# Patient Record
Sex: Male | Born: 1975 | Race: White | Hispanic: No | Marital: Married | Smoking: Never smoker
Health system: Southern US, Community
[De-identification: ages and names within clinical notes are randomized; demographics above are authoritative.]

## PROBLEM LIST (undated history)

## (undated) DIAGNOSIS — K449 Diaphragmatic hernia without obstruction or gangrene: Secondary | ICD-10-CM

## (undated) DIAGNOSIS — J939 Pneumothorax, unspecified: Secondary | ICD-10-CM

## (undated) HISTORY — PX: CHEST TUBE INSERTION: SHX231

---

## 2001-10-16 ENCOUNTER — Encounter: Payer: Self-pay | Admitting: Emergency Medicine

## 2001-10-16 ENCOUNTER — Emergency Department (HOSPITAL_COMMUNITY): Admission: EM | Admit: 2001-10-16 | Discharge: 2001-10-16 | Payer: Self-pay

## 2004-01-26 ENCOUNTER — Emergency Department (HOSPITAL_COMMUNITY): Admission: EM | Admit: 2004-01-26 | Discharge: 2004-01-27 | Payer: Self-pay | Admitting: *Deleted

## 2013-03-21 ENCOUNTER — Inpatient Hospital Stay (HOSPITAL_COMMUNITY)
Admission: EM | Admit: 2013-03-21 | Discharge: 2013-03-27 | DRG: 201 | Disposition: A | Discharge status: Home / Self Care | Payer: Self-pay | Attending: General Surgery | Admitting: General Surgery

## 2013-03-21 ENCOUNTER — Encounter (HOSPITAL_COMMUNITY): Payer: Self-pay | Admitting: Emergency Medicine

## 2013-03-21 ENCOUNTER — Emergency Department (HOSPITAL_COMMUNITY): Payer: Self-pay

## 2013-03-21 DIAGNOSIS — K449 Diaphragmatic hernia without obstruction or gangrene: Secondary | ICD-10-CM | POA: Diagnosis present

## 2013-03-21 DIAGNOSIS — J9383 Other pneumothorax: Principal | ICD-10-CM | POA: Diagnosis present

## 2013-03-21 DIAGNOSIS — Z881 Allergy status to other antibiotic agents status: Secondary | ICD-10-CM

## 2013-03-21 DIAGNOSIS — J939 Pneumothorax, unspecified: Secondary | ICD-10-CM

## 2013-03-21 DIAGNOSIS — F17200 Nicotine dependence, unspecified, uncomplicated: Secondary | ICD-10-CM | POA: Diagnosis present

## 2013-03-21 HISTORY — DX: Diaphragmatic hernia without obstruction or gangrene: K44.9

## 2013-03-21 HISTORY — DX: Pneumothorax, unspecified: J93.9

## 2013-03-21 LAB — COMPREHENSIVE METABOLIC PANEL
ALT: 11 U/L (ref 0–53)
AST: 15 U/L (ref 0–37)
Albumin: 4.5 g/dL (ref 3.5–5.2)
Alkaline Phosphatase: 67 U/L (ref 39–117)
BILIRUBIN TOTAL: 0.4 mg/dL (ref 0.3–1.2)
BUN: 7 mg/dL (ref 6–23)
CO2: 30 meq/L (ref 19–32)
Calcium: 9.7 mg/dL (ref 8.4–10.5)
Chloride: 102 mEq/L (ref 96–112)
Creatinine, Ser: 0.96 mg/dL (ref 0.50–1.35)
GFR calc Af Amer: >90 mL/min (ref >90)
GFR calc non Af Amer: >90 mL/min (ref >90)
GLUCOSE: 107 mg/dL — AB (ref 70–99)
Potassium: 4.8 mEq/L (ref 3.7–5.3)
Sodium: 143 mEq/L (ref 137–147)
Total Protein: 7.9 g/dL (ref 6.0–8.3)

## 2013-03-21 LAB — CBC WITH DIFFERENTIAL/PLATELET
Basophils Absolute: 0 10*3/uL (ref 0.0–0.1)
Basophils Relative: 1 % (ref 0–1)
Eosinophils Absolute: 0.1 10*3/uL (ref 0.0–0.7)
Eosinophils Relative: 1 % (ref 0–5)
HCT: 43.9 % (ref 39.0–52.0)
Hemoglobin: 15.3 g/dL (ref 13.0–17.0)
LYMPHS ABS: 1.5 10*3/uL (ref 0.7–4.0)
Lymphocytes Relative: 20 % (ref 12–46)
MCH: 33 pg (ref 26.0–34.0)
MCHC: 34.9 g/dL (ref 30.0–36.0)
MCV: 94.8 fL (ref 78.0–100.0)
MONOS PCT: 5 % (ref 3–12)
Monocytes Absolute: 0.4 10*3/uL (ref 0.1–1.0)
NEUTROS ABS: 5.5 10*3/uL (ref 1.7–7.7)
Neutrophils Relative %: 73 % (ref 43–77)
Platelets: 190 10*3/uL (ref 150–400)
RBC: 4.63 MIL/uL (ref 4.22–5.81)
RDW: 12.3 % (ref 11.5–15.5)
WBC: 7.6 10*3/uL (ref 4.0–10.5)

## 2013-03-21 LAB — TROPONIN I: Troponin I: <0.3 ng/mL (ref <0.30)

## 2013-03-21 MED ORDER — DIPHENHYDRAMINE HCL 12.5 MG/5ML PO ELIX
12.5000 mg | ORAL_SOLUTION | Freq: Four times a day (QID) | ORAL | Status: DC | PRN
  Filled 2013-03-21: qty 10

## 2013-03-21 MED ORDER — ACETAMINOPHEN 650 MG RE SUPP: 650.0000 mg | Freq: Four times a day (QID) | RECTAL | Status: DC | PRN

## 2013-03-21 MED ORDER — ENOXAPARIN SODIUM 40 MG/0.4ML ~~LOC~~ SOLN
40.0000 mg | SUBCUTANEOUS | Status: DC
  Administered 2013-03-21 – 2013-03-26 (×5): 40 mg via SUBCUTANEOUS
  Filled 2013-03-21 (×6): qty 0.4

## 2013-03-21 MED ORDER — ONDANSETRON HCL 4 MG/2ML IJ SOLN
4.0000 mg | Freq: Four times a day (QID) | INTRAMUSCULAR | Status: DC | PRN
  Administered 2013-03-22: 4 mg via INTRAVENOUS
  Filled 2013-03-21: qty 2

## 2013-03-21 MED ORDER — HYDROMORPHONE HCL PF 1 MG/ML IJ SOLN: 1.0000 mg | Freq: Once | INTRAMUSCULAR | Status: AC

## 2013-03-21 MED ORDER — SODIUM CHLORIDE 0.9 % IV BOLUS (SEPSIS)
500.0000 mL | Freq: Once | INTRAVENOUS | Status: AC
  Administered 2013-03-21: 500 mL via INTRAVENOUS

## 2013-03-21 MED ORDER — ONDANSETRON HCL 4 MG/2ML IJ SOLN
4.0000 mg | Freq: Once | INTRAMUSCULAR | Status: AC
  Administered 2013-03-21: 4 mg via INTRAVENOUS
  Filled 2013-03-21: qty 2

## 2013-03-21 MED ORDER — DIPHENHYDRAMINE HCL 50 MG/ML IJ SOLN: 12.5000 mg | Freq: Four times a day (QID) | INTRAMUSCULAR | Status: DC | PRN

## 2013-03-21 MED ORDER — HYDROMORPHONE HCL PF 1 MG/ML IJ SOLN
1.0000 mg | INTRAMUSCULAR | Status: DC | PRN
  Administered 2013-03-26: 1 mg via INTRAVENOUS
  Filled 2013-03-21: qty 1

## 2013-03-21 MED ORDER — HYDROMORPHONE HCL PF 1 MG/ML IJ SOLN
1.0000 mg | Freq: Once | INTRAMUSCULAR | Status: AC
  Administered 2013-03-21: 1 mg via INTRAVENOUS
  Filled 2013-03-21: qty 1

## 2013-03-21 MED ORDER — ACETAMINOPHEN 325 MG PO TABS: 650.0000 mg | ORAL_TABLET | Freq: Four times a day (QID) | ORAL | Status: DC | PRN

## 2013-03-21 MED ORDER — SODIUM CHLORIDE 0.9 % IV SOLN
INTRAVENOUS | Status: DC
  Administered 2013-03-21: 16:00:00 via INTRAVENOUS

## 2013-03-21 MED ORDER — OXYCODONE HCL 5 MG PO TABS
5.0000 mg | ORAL_TABLET | ORAL | Status: DC | PRN
  Administered 2013-03-22 (×2): 5 mg via ORAL
  Filled 2013-03-21 (×2): qty 1

## 2013-03-21 NOTE — ED Notes (Signed)
Patients heart rate noted to be in the 30s. Patient denies any complaints and patient in NAD. Heart rate did go back up to the high 40s. Will continue to monitor patient.

## 2013-03-21 NOTE — ED Notes (Signed)
Chest pain for 2 weeks, worse today,increased pain with deep breath,

## 2013-03-21 NOTE — ED Provider Notes (Signed)
CSN: [id number]     Arrival date & time 03/21/13  1357 History   First MD Initiated Contact with Patient 03/21/13 1516  This chart was scribed for Mervin Kung, MD by Anastasia Pall, ED Scribe. This patient was seen in room APA09/APA09 and the patient's care was started at 3:21 PM.     Chief Complaint  Patient presents with  . Chest Pain    Patient is a 38 y.o. male presenting with chest pain. The history is provided by the patient. No language interpreter was used.  Chest Pain Pain location:  Substernal area Pain radiates to:  Mid back Pain radiates to the back: yes   Duration:  2 weeks Timing:  Constant Progression:  Worsening Chronicity:  New Worsened by:  Deep breathing Associated symptoms: back pain and shortness of breath   Associated symptoms: no abdominal pain, no dizziness, no fever, no headache, no nausea and not vomiting    HPI Comments: Allen Ward is a 38 y.o. male who presents to the Emergency Department complaining of intermittent, right, central chest pain, onset 2 weeks ago. He reports his chest pain became constant after waking up this morning, with a severity of 5/10 at baseline, and 9/10 at its worst. He states the pain radiates a little to his back, and reports that deep breathing and and laying down exacerbates the pain. He denies h/o similar pain. Pt denies allergy to pain medications. He denies any other symptoms and illnesses.   PCP - No PCP Per Patient  Past Medical History  Diagnosis Date  . Hiatal hernia    History reviewed. No pertinent past surgical history. History reviewed. No pertinent family history. History  Substance Use Topics  . Smoking status: Current Some Day Smoker  . Smokeless tobacco: Not on file  . Alcohol Use: No    Review of Systems  Constitutional: Positive for chills. Negative for fever.  HENT: Positive for congestion.   Eyes: Negative for visual disturbance.  Respiratory: Positive for shortness of breath.    Cardiovascular: Positive for chest pain. Negative for leg swelling.  Gastrointestinal: Negative for nausea, vomiting, abdominal pain and diarrhea.  Genitourinary: Negative for dysuria and hematuria.  Musculoskeletal: Positive for back pain.  Skin: Negative for rash.  Neurological: Negative for dizziness and headaches.  Hematological: Does not bruise/bleed easily.  Psychiatric/Behavioral: Negative for confusion.    Allergies  Amoxicillin  Home Medications  No current outpatient prescriptions on file.  BP 143/102  Pulse 55  Temp(Src) 97.3 F (36.3 C) (Oral)  Resp 20  SpO2 100%  Physical Exam  Nursing note and vitals reviewed. Constitutional: He is oriented to person, place, and time. He appears well-developed and well-nourished. No distress.  HENT:  Head: Normocephalic and atraumatic.  Eyes: EOM are normal.  Neck: Neck supple. No tracheal deviation present.  Cardiovascular: Normal rate, regular rhythm and normal heart sounds.  Exam reveals no gallop and no friction rub.   No murmur heard. Pulmonary/Chest: Effort normal and breath sounds normal. No respiratory distress. He has no wheezes. He has no rales.  Abdominal: Soft. Bowel sounds are normal. There is no tenderness.  Musculoskeletal: Normal range of motion. He exhibits no edema.  Neurological: He is alert and oriented to person, place, and time. No cranial nerve deficit or sensory deficit. He exhibits normal muscle tone. Coordination normal.  Skin: Skin is warm and dry.  Psychiatric: He has a normal mood and affect. His behavior is normal.    ED Course  Procedures (  including critical care time)  DIAGNOSTIC STUDIES: Oxygen Saturation is 100% on room air, normal by my interpretation.    COORDINATION OF CARE: 3:27 PM-Discussed treatment plan which includes O2 and pain medication with pt at bedside and pt agreed to plan.    Results for orders placed during the hospital encounter of 03/21/13  CBC WITH DIFFERENTIAL       Result Value Range   WBC 7.6  4.0 - 10.5 K/uL   RBC 4.63  4.22 - 5.81 MIL/uL   Hemoglobin 15.3  13.0 - 17.0 g/dL   HCT 43.9  39.0 - 52.0 %   MCV 94.8  78.0 - 100.0 fL   MCH 33.0  26.0 - 34.0 pg   MCHC 34.9  30.0 - 36.0 g/dL   RDW 12.3  11.5 - 15.5 %   Platelets 190  150 - 400 K/uL   Neutrophils Relative % 73  43 - 77 %   Neutro Abs 5.5  1.7 - 7.7 K/uL   Lymphocytes Relative 20  12 - 46 %   Lymphs Abs 1.5  0.7 - 4.0 K/uL   Monocytes Relative 5  3 - 12 %   Monocytes Absolute 0.4  0.1 - 1.0 K/uL   Eosinophils Relative 1  0 - 5 %   Eosinophils Absolute 0.1  0.0 - 0.7 K/uL   Basophils Relative 1  0 - 1 %   Basophils Absolute 0.0  0.0 - 0.1 K/uL  COMPREHENSIVE METABOLIC PANEL      Result Value Range   Sodium 143  137 - 147 mEq/L   Potassium 4.8  3.7 - 5.3 mEq/L   Chloride 102  96 - 112 mEq/L   CO2 30  19 - 32 mEq/L   Glucose, Bld 107 (*) 70 - 99 mg/dL   BUN 7  6 - 23 mg/dL   Creatinine, Ser 0.96  0.50 - 1.35 mg/dL   Calcium 9.7  8.4 - 10.5 mg/dL   Total Protein 7.9  6.0 - 8.3 g/dL   Albumin 4.5  3.5 - 5.2 g/dL   AST 15  0 - 37 U/L   ALT 11  0 - 53 U/L   Alkaline Phosphatase 67  39 - 117 U/L   Total Bilirubin 0.4  0.3 - 1.2 mg/dL   GFR calc non Af Amer >90  >90 mL/min   GFR calc Af Amer >90  >90 mL/min  TROPONIN I      Result Value Range   Troponin I <0.30  <0.30 ng/mL   Dg Chest 2 View  03/21/2013   CLINICAL DATA:  Right side chest pain and shortness of breath.  EXAM: CHEST  2 VIEW  COMPARISON:  None.  FINDINGS: The patient has a right pneumothorax estimated at 20%. The left lung is expanded and clear. No pleural effusion. Heart size is normal.  IMPRESSION: Right pneumothorax estimated at 20%. Critical Value/emergent results were called by telephone at the time of interpretation on 03/21/2013 at 2:26 PM to Dr. Roderic Palau, who verbally acknowledged these results.   Electronically Signed   By: Inge Rise M.D.   On: 03/21/2013 14:28   Medications  0.9 %  sodium chloride  infusion ( Intravenous New Bag/Given 03/21/13 1613)  ondansetron (ZOFRAN) injection 4 mg (4 mg Intravenous Given 03/21/13 1545)  HYDROmorphone (DILAUDID) injection 1 mg (1 mg Intravenous Given 03/21/13 1545)  sodium chloride 0.9 % bolus 500 mL (0 mLs Intravenous Stopped 03/21/13 1612)     EKG Interpretation  Date/Time:  Monday March 21 2013 16:00:05 EST Ventricular Rate:  47 PR Interval:  98 QRS Duration: 106 QT Interval:  452 QTC Calculation: 400 R Axis:   63 Text Interpretation:  Sinus bradycardia with short PR Incomplete right bundle branch block Borderline ECG When compared with ECG of 21-Mar-2013 14:01, Sinus rhythm has replaced Wide QRS rhythm Significant changes have occurred But current EKG,  less concerning than previous tracing. Confirmed by Rogene Houston  MD, Bruin ([id number]) on 03/21/2013 4:14:11 PM            MDM   1. Pneumothorax on right    Most likely spontaneous pneumothorax right side 20% discussed with general surgery Dr. Arnoldo Morale he will reinflated the lung with a small chest tube. Patient will be admitted. Patient's repeat EKG showed significant changes from the original one the Ritalin probably was not his EKG. The repeat EKG is sinus bradycardia with short PR incomplete right bundle branch block heart rate of 47 seems to match was on the monitors as well. Repeat EKG was ordered because the monitor did not look anything like his original EKG.    I personally performed the services described in this documentation, which was scribed in my presence. The recorded information has been reviewed and is accurate.     Mervin Kung, MD 03/21/13 [phone]

## 2013-03-21 NOTE — H&P (Signed)
Allen Ward is an 38 y.o. male.   Chief Complaint: Right-sided chest pain HPI: Patient is a 38 year old nonsmoking white male who presented emergency room with worsening right-sided chest pain. A chest x-ray was performed in the emergency room which revealed a 20% right pneumothorax. The patient has never had a collapse of his lung. He has never been a smoker.  Past Medical History  Diagnosis Date  . Hiatal hernia     History reviewed. No pertinent past surgical history.  History reviewed. No pertinent family history. Social History:  reports that he has been smoking.  He does not have any smokeless tobacco history on file. He reports that he uses illicit drugs (Marijuana). He reports that he does not drink alcohol.  Allergies:  Allergies  Allergen Reactions  . Amoxicillin Hives and Shortness Of Breath     (Not in a hospital admission)  Results for orders placed during the hospital encounter of 03/21/13 (from the past 48 hour(s))  CBC WITH DIFFERENTIAL     Status: None   Collection Time    03/21/13  2:26 PM      Result Value Range   WBC 7.6  4.0 - 10.5 K/uL   RBC 4.63  4.22 - 5.81 MIL/uL   Hemoglobin 15.3  13.0 - 17.0 g/dL   HCT 43.9  39.0 - 52.0 %   MCV 94.8  78.0 - 100.0 fL   MCH 33.0  26.0 - 34.0 pg   MCHC 34.9  30.0 - 36.0 g/dL   RDW 12.3  11.5 - 15.5 %   Platelets 190  150 - 400 K/uL   Neutrophils Relative % 73  43 - 77 %   Neutro Abs 5.5  1.7 - 7.7 K/uL   Lymphocytes Relative 20  12 - 46 %   Lymphs Abs 1.5  0.7 - 4.0 K/uL   Monocytes Relative 5  3 - 12 %   Monocytes Absolute 0.4  0.1 - 1.0 K/uL   Eosinophils Relative 1  0 - 5 %   Eosinophils Absolute 0.1  0.0 - 0.7 K/uL   Basophils Relative 1  0 - 1 %   Basophils Absolute 0.0  0.0 - 0.1 K/uL  COMPREHENSIVE METABOLIC PANEL     Status: Abnormal   Collection Time    03/21/13  2:26 PM      Result Value Range   Sodium 143  137 - 147 mEq/L   Potassium 4.8  3.7 - 5.3 mEq/L   Chloride 102  96 - 112 mEq/L   CO2  30  19 - 32 mEq/L   Glucose, Bld 107 (*) 70 - 99 mg/dL   BUN 7  6 - 23 mg/dL   Creatinine, Ser 0.96  0.50 - 1.35 mg/dL   Calcium 9.7  8.4 - 10.5 mg/dL   Total Protein 7.9  6.0 - 8.3 g/dL   Albumin 4.5  3.5 - 5.2 g/dL   AST 15  0 - 37 U/L   ALT 11  0 - 53 U/L   Alkaline Phosphatase 67  39 - 117 U/L   Total Bilirubin 0.4  0.3 - 1.2 mg/dL   GFR calc non Af Amer >90  >90 mL/min   GFR calc Af Amer >90  >90 mL/min   Comment: (NOTE)     The eGFR has been calculated using the CKD EPI equation.     This calculation has not been validated in all clinical situations.     eGFR's persistently <90 mL/min  signify possible Chronic Kidney     Disease.  TROPONIN I     Status: None   Collection Time    03/21/13  2:26 PM      Result Value Range   Troponin I <0.30  <0.30 ng/mL   Comment:            Due to the release kinetics of cTnI,     a negative result within the first hours     of the onset of symptoms does not rule out     myocardial infarction with certainty.     If myocardial infarction is still suspected,     repeat the test at appropriate intervals.   Dg Chest 2 View  03/21/2013   CLINICAL DATA:  Right side chest pain and shortness of breath.  EXAM: CHEST  2 VIEW  COMPARISON:  None.  FINDINGS: The patient has a right pneumothorax estimated at 20%. The left lung is expanded and clear. No pleural effusion. Heart size is normal.  IMPRESSION: Right pneumothorax estimated at 20%. Critical Value/emergent results were called by telephone at the time of interpretation on 03/21/2013 at 2:26 PM to Dr. Roderic Palau, who verbally acknowledged these results.   Electronically Signed   By: Inge Rise M.D.   On: 03/21/2013 14:28    Review of Systems  Constitutional: Negative.   Respiratory: Positive for shortness of breath.   Cardiovascular: Positive for chest pain.  Gastrointestinal: Negative.   Skin: Negative.   All other systems reviewed and are negative.    Blood pressure 133/76, pulse 49,  temperature 97.3 F (36.3 C), temperature source Oral, resp. rate 15, SpO2 100.00%. Physical Exam  Vitals reviewed. Constitutional: He is oriented to person, place, and time. He appears well-developed and well-nourished.  HENT:  Head: Normocephalic and atraumatic.  Neck: Normal range of motion. Neck supple.  Cardiovascular: Normal rate, regular rhythm and normal heart sounds.   Respiratory: Effort normal and breath sounds normal. No respiratory distress. He has no wheezes. He has no rales.  GI: Soft. Bowel sounds are normal.  Neurological: He is alert and oriented to person, place, and time.  Skin: Skin is warm and dry.     Assessment/Plan Impression: Spontaneous right pneumothorax Plan: A small bore right chest tube will be placed in the emergency room. He will then be admitted to the hospital for further management treatment. The risks and benefits of the procedure were fully explained to the patient, who gave informed consent.  [name] 03/21/2013, 5:16 PM

## 2013-03-21 NOTE — ED Notes (Signed)
Assisted Dr Arnoldo Morale with chest tube insertion.

## 2013-03-21 NOTE — Procedures (Signed)
Chest Tube Insertion Procedure Note  Indications:  Clinically significant Pneumothorax  Pre-operative Diagnosis: Pneumothorax, right spontaneous  Post-operative Diagnosis: Pneumothorax, right spontaneous  Procedure Details  Informed consent was obtained for the procedure, including sedation.  Risks of lung perforation, hemorrhage, arrhythmia, and adverse drug reaction were discussed.   After sterile skin prep, using standard technique, a 20 French tube was placed in the right anterior 5th rib space.  Findings: None  Estimated Blood Loss:  less than 50 mL         Specimens:  None              Complications:  None; patient tolerated the procedure well.         Disposition: Medical surgical floor         Condition: stable  Attending Attestation: I performed the procedure.  Chest x-ray pending.

## 2013-03-22 ENCOUNTER — Inpatient Hospital Stay (HOSPITAL_COMMUNITY): Payer: Self-pay

## 2013-03-22 NOTE — Progress Notes (Signed)
Utilization Review Complete  

## 2013-03-22 NOTE — Progress Notes (Signed)
  Subjective: Right-sided chest pain resolved. Resting comfortably.  Objective: Vital signs in last 24 hours: Temp:  [97.3 F (36.3 C)-97.7 F (36.5 C)] 97.7 F (36.5 C) (01/06 0447) Pulse Rate:  [40-66] 42 (01/06 0447) Resp:  [11-20] 11 (01/05 1900) BP: (108-143)/(51-102) 112/67 mmHg (01/06 0447) SpO2:  [93 %-100 %] 100 % (01/06 0447) Weight:  [61.236 kg (135 lb)] 61.236 kg (135 lb) (01/05 2033) Last BM Date: 03/21/13  Intake/Output from previous day:   Intake/Output this shift:    General appearance: alert, cooperative and no distress Resp: clear to auscultation bilaterally and Chest tube without air leak Cardio: regular rate and rhythm, S1, S2 normal, no murmur, click, rub or gallop  Lab Results:   Recent Labs  03/21/13 1426  WBC 7.6  HGB 15.3  HCT 43.9  PLT 190   BMET  Recent Labs  03/21/13 1426  NA 143  K 4.8  CL 102  CO2 30  GLUCOSE 107*  BUN 7  CREATININE 0.96  CALCIUM 9.7   PT/INR No results found for this basename: LABPROT, INR,  in the last 72 hours  Studies/Results: Dg Chest 2 View  03/21/2013   CLINICAL DATA:  Right side chest pain and shortness of breath.  EXAM: CHEST  2 VIEW  COMPARISON:  None.  FINDINGS: The patient has a right pneumothorax estimated at 20%. The left lung is expanded and clear. No pleural effusion. Heart size is normal.  IMPRESSION: Right pneumothorax estimated at 20%. Critical Value/emergent results were called by telephone at the time of interpretation on 03/21/2013 at 2:26 PM to Dr. Roderic Palau, who verbally acknowledged these results.   Electronically Signed   By: Inge Rise M.D.   On: 03/21/2013 14:28   Dg Chest Port 1 View  03/21/2013   CLINICAL DATA:  Placement of right-sided chest tube.  EXAM: PORTABLE CHEST - 1 VIEW  COMPARISON:  03/21/2013.  FINDINGS: Small bore right-sided chest tube projects over the lateral aspect of the right upper lung zone.  Slight decrease in the size of the right sided pneumothorax (now crossing  the posterior aspect of the right 3rd rib versus prior exam when this crossed posterior aspect the right 4th rib).  No segmental infiltrate or pulmonary edema.  Heart size within normal limits.  IMPRESSION: Right-sided chest tube in place with slight decrease in size of right-sided apical pneumothorax as noted above   Electronically Signed   By: Chauncey Cruel M.D.   On: 03/21/2013 17:15    Anti-infectives: Anti-infectives   None      Assessment/Plan: Impression: Spontaneous right pneumothorax. Chest x-ray this morning shows slight decrease in right apical pneumothorax. Chest tube in adequate position. Plan: Continue current management. Will recheck chest x-ray in AM.  LOS: 1 day    Faith Patricelli A 03/22/2013

## 2013-03-23 ENCOUNTER — Inpatient Hospital Stay (HOSPITAL_COMMUNITY): Payer: Self-pay

## 2013-03-23 NOTE — Progress Notes (Signed)
Subjective: No complaints of shortness of breath or pain.  Objective: Vital signs in last 24 hours: Temp:  [97.7 F (36.5 C)-98.5 F (36.9 C)] 98.5 F (36.9 C) (01/07 0504) Pulse Rate:  [50-57] 57 (01/07 0504) Resp:  [16-20] 18 (01/07 0504) BP: (114-133)/(75-87) 128/84 mmHg (01/07 0504) SpO2:  [97 %-100 %] 97 % (01/07 0504) Last BM Date: 03/21/13  Intake/Output from previous day: 01/06 0701 - 01/07 0700 In: 520 [P.O.:520] Out: 900 [Urine:900] Intake/Output this shift: Total I/O In: 240 [P.O.:240] Out: 550 [Urine:550]  General appearance: alert, cooperative and no distress Resp: clear to auscultation bilaterally and Chest tube with occasional air leak. Cardio: regular rate and rhythm, S1, S2 normal, no murmur, click, rub or gallop  Lab Results:   Recent Labs  03/21/13 1426  WBC 7.6  HGB 15.3  HCT 43.9  PLT 190   BMET  Recent Labs  03/21/13 1426  NA 143  K 4.8  CL 102  CO2 30  GLUCOSE 107*  BUN 7  CREATININE 0.96  CALCIUM 9.7   PT/INR No results found for this basename: LABPROT, INR,  in the last 72 hours  Studies/Results: Dg Chest 2 View  03/21/2013   CLINICAL DATA:  Right side chest pain and shortness of breath.  EXAM: CHEST  2 VIEW  COMPARISON:  None.  FINDINGS: The patient has a right pneumothorax estimated at 20%. The left lung is expanded and clear. No pleural effusion. Heart size is normal.  IMPRESSION: Right pneumothorax estimated at 20%. Critical Value/emergent results were called by telephone at the time of interpretation on 03/21/2013 at 2:26 PM to Dr. Roderic Palau, who verbally acknowledged these results.   Electronically Signed   By: Inge Rise M.D.   On: 03/21/2013 14:28   Dg Chest Port 1 View  03/23/2013   CLINICAL DATA:  Spontaneous right pneumothorax.  EXAM: PORTABLE CHEST - 1 VIEW  COMPARISON:  03/22/2013  FINDINGS: Small caliber chest tube remains in place on the right. There is a persistent apical pneumothorax of approximately 10% volume.  This is marginally larger than on the prior chest x-ray. No component of pleural fluid is identified. No underlying pulmonary lesion or consolidation is seen.  IMPRESSION: Residual roughly 10% right apical pneumothorax which is marginally larger than on the prior chest x-ray.   Electronically Signed   By: Aletta Edouard M.D.   On: 03/23/2013 08:21   Dg Chest Port 1 View  03/22/2013   CLINICAL DATA:  Follow-up right pneumothorax with small more chest tube in place.  EXAM: PORTABLE CHEST - 1 VIEW  COMPARISON:  Portable exam and two-view chest x-ray yesterday.  FINDINGS: Small caliber chest tube in place. Persistent small (5-10% or so) right apical pneumothorax, unchanged. Lungs clear. Bronchovascular markings normal. Pulmonary vascularity normal. No visible pleural effusions. No pneumothorax. Cardiomediastinal silhouette unremarkable.  IMPRESSION: Stable small (5-10% or so) right apical pneumothorax with small caliber chest tube in place.   Electronically Signed   By: Evangeline Dakin M.D.   On: 03/22/2013 07:56   Dg Chest Port 1 View  03/21/2013   CLINICAL DATA:  Placement of right-sided chest tube.  EXAM: PORTABLE CHEST - 1 VIEW  COMPARISON:  03/21/2013.  FINDINGS: Small bore right-sided chest tube projects over the lateral aspect of the right upper lung zone.  Slight decrease in the size of the right sided pneumothorax (now crossing the posterior aspect of the right 3rd rib versus prior exam when this crossed posterior aspect the right 4th rib).  No segmental infiltrate or pulmonary edema.  Heart size within normal limits.  IMPRESSION: Right-sided chest tube in place with slight decrease in size of right-sided apical pneumothorax as noted above   Electronically Signed   By: Chauncey Cruel M.D.   On: 03/21/2013 17:15    Anti-infectives: Anti-infectives   None      Assessment/Plan: Impression: Spontaneous right pneumothorax. Patient still has a residual 10% right apical pneumothorax. Have increased the  suction to -40cm. He does have an occasional air leak. Will continue current management. Repeat chest x-ray in a.m.  LOS: 2 days    Majestic Brister A 03/23/2013

## 2013-03-24 ENCOUNTER — Encounter (HOSPITAL_COMMUNITY): Payer: Self-pay | Admitting: Certified Registered"

## 2013-03-24 ENCOUNTER — Inpatient Hospital Stay (HOSPITAL_COMMUNITY): Payer: Self-pay

## 2013-03-24 ENCOUNTER — Other Ambulatory Visit: Payer: Self-pay | Admitting: *Deleted

## 2013-03-24 DIAGNOSIS — J939 Pneumothorax, unspecified: Secondary | ICD-10-CM

## 2013-03-24 LAB — BLOOD GAS, ARTERIAL
Acid-Base Excess: 3.7 mmol/L — ABNORMAL HIGH (ref 0.0–2.0)
Bicarbonate: 27.7 mEq/L — ABNORMAL HIGH (ref 20.0–24.0)
Drawn by: 36274
FIO2: 0.21 %
O2 Saturation: 98.1 %
Patient temperature: 98.6
TCO2: 29 mmol/L (ref 0–100)
pCO2 arterial: 41.5 mmHg (ref 35.0–45.0)
pH, Arterial: 7.44 (ref 7.350–7.450)
pO2, Arterial: 99.7 mmHg (ref 80.0–100.0)

## 2013-03-24 LAB — COMPREHENSIVE METABOLIC PANEL
ALT: 12 U/L (ref 0–53)
AST: 14 U/L (ref 0–37)
Albumin: 4.4 g/dL (ref 3.5–5.2)
Alkaline Phosphatase: 71 U/L (ref 39–117)
BUN: 13 mg/dL (ref 6–23)
CO2: 27 mEq/L (ref 19–32)
Calcium: 9.8 mg/dL (ref 8.4–10.5)
Chloride: 98 mEq/L (ref 96–112)
Creatinine, Ser: 0.88 mg/dL (ref 0.50–1.35)
GFR calc Af Amer: >90 mL/min (ref >90)
GFR calc non Af Amer: >90 mL/min (ref >90)
Glucose, Bld: 130 mg/dL — ABNORMAL HIGH (ref 70–99)
Potassium: 4 mEq/L (ref 3.7–5.3)
Sodium: 141 mEq/L (ref 137–147)
Total Bilirubin: 0.6 mg/dL (ref 0.3–1.2)
Total Protein: 8 g/dL (ref 6.0–8.3)

## 2013-03-24 LAB — URINALYSIS, ROUTINE W REFLEX MICROSCOPIC
Bilirubin Urine: NEGATIVE
Glucose, UA: NEGATIVE mg/dL
Hgb urine dipstick: NEGATIVE
Ketones, ur: NEGATIVE mg/dL
Leukocytes, UA: NEGATIVE
Nitrite: NEGATIVE
Protein, ur: NEGATIVE mg/dL
Specific Gravity, Urine: 1.013 (ref 1.005–1.030)
Urobilinogen, UA: 1 mg/dL (ref 0.0–1.0)
pH: 6.5 (ref 5.0–8.0)

## 2013-03-24 LAB — CBC
HCT: 46.2 % (ref 39.0–52.0)
Hemoglobin: 16.9 g/dL (ref 13.0–17.0)
MCH: 33.7 pg (ref 26.0–34.0)
MCHC: 36.6 g/dL — ABNORMAL HIGH (ref 30.0–36.0)
MCV: 92.2 fL (ref 78.0–100.0)
Platelets: 180 10*3/uL (ref 150–400)
RBC: 5.01 MIL/uL (ref 4.22–5.81)
RDW: 12.2 % (ref 11.5–15.5)
WBC: 8.7 10*3/uL (ref 4.0–10.5)

## 2013-03-24 LAB — TYPE AND SCREEN
ABO/RH(D): A NEG
Antibody Screen: NEGATIVE

## 2013-03-24 LAB — ABO/RH: ABO/RH(D): A NEG

## 2013-03-24 LAB — SURGICAL PCR SCREEN
MRSA, PCR: NEGATIVE
Staphylococcus aureus: NEGATIVE

## 2013-03-24 LAB — PROTIME-INR
INR: 1.03 (ref 0.00–1.49)
Prothrombin Time: 13.3 seconds (ref 11.6–15.2)

## 2013-03-24 LAB — APTT: aPTT: 33 seconds (ref 24–37)

## 2013-03-24 MED ORDER — VANCOMYCIN HCL IN DEXTROSE 1-5 GM/200ML-% IV SOLN
1000.0000 mg | INTRAVENOUS | Status: AC
  Filled 2013-03-24: qty 200

## 2013-03-24 NOTE — Discharge Summary (Signed)
Physician Discharge Summary  Patient ID: Allen Ward MRN: [id number] DOB/AGE: 07-10-1975 38 y.o.  Admit date: 03/21/2013 Discharge date: 03/24/2013  Admission Diagnoses:Spontaneous right pneumothorax  Discharge Diagnoses: Same Active Problems:   Pneumothorax   Discharged Condition: good  Hospital Course: Patient is a 38yo nonsmoking WM who presented to ER on 03/21/13 with right sided chest pain.  Found on CXR to have a right pneumothorax.  Surgery consulted, right chest tube placed.  While in hospital, patient's right pneumothorax has not fully resolved, and he has developed a persistent air leak.  Patient has been asymptomatic.  Due to persistent air leak, Dr. Servando Snare of Thoracic Surgery has been consulted and accepts patient on transfer for further evaluation and treatment.  Patient has been made aware.  Consults: Thoracic surgery  Treatments: surgery: Right closed tube thoracostomy on 03/21/13  Discharge Exam: Blood pressure 135/84, pulse 58, temperature 98 F (36.7 C), temperature source Oral, resp. rate 18, height 6\' 1"  (1.854 m), weight 61.236 kg (135 lb), SpO2 100.00%. General appearance: alert, cooperative and no distress Resp: clear to auscultation bilaterally and air leak in chest tube, intermittent. Cardio: regular rate and rhythm, S1, S2 normal, no murmur, click, rub or gallop  Disposition: Transfer to [facility]    Medication List    Notice   You have not been prescribed any medications.       SignedAviva Signs A 03/24/2013, 11:06 AM

## 2013-03-24 NOTE — Care Management Note (Addendum)
    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 Does not have a PCP. Works until late in day and "just hasn't gotten around to it'. CM called his first two choices and  the second [facility] will accept but since he has no insurance pt will     Action/Plan:   need to be prepared to pay $150-$200 up front. Pt being transfer to [facility] today for further care and Abigail Butts at practice states to call [phone]) nearing DC to set up an appt.   Anticipated DC Date:  03/27/2013   Anticipated DC Plan:  [place]  CM consult      Choice offered to / List presented to:             Status of service:  Completed, signed off Medicare Important Message given?   (If response is "NO", the following Medicare IM given date fields will be blank) Date Medicare IM given:   Date Additional Medicare IM given:    Discharge Disposition:  ACUTE TO ACUTE TRANS  Per UR Regulation:    If discussed at Long Length of Stay Meetings, dates discussed:    Comments:  03/25/13 16:26 Tomi Bamberger RN, BSN [phone] NCM spoke with NCM at [facility], she states patient chose [facility] for his follow up , this NCM called and scheduled apt for 1/20 at 9:15 am, patient will need to be prepared to pay upfront 150-200.00.  03/24/13 Claretha Cooper RN BSN CM

## 2013-03-24 NOTE — Progress Notes (Signed)
PAtient to be transferred to 5W by EMS.  Under Dr. Carrie Mew service.  Report called and given to [place], RN on 5W.  Patient in stable condition.

## 2013-03-24 NOTE — Progress Notes (Signed)
  Subjective: No shortness of breath or chest pain.  Objective: Vital signs in last 24 hours: Temp:  [98 F (36.7 C)-98.2 F (36.8 C)] 98 F (36.7 C) (01/08 0033) Pulse Rate:  [58-70] 58 (01/08 0033) Resp:  [18] 18 (01/08 0033) BP: (126-135)/(84-87) 135/84 mmHg (01/08 0033) SpO2:  [100 %] 100 % (01/08 0033) Last BM Date: 03/21/13  Intake/Output from previous day: 01/07 0701 - 01/08 0700 In: 720 [P.O.:720] Out: 950 [Urine:950] Intake/Output this shift:    General appearance: alert, cooperative and no distress Resp: clear to auscultation bilaterally and Chest tube with intermittent air leak. Cardio: regular rate and rhythm, S1, S2 normal, no murmur, click, rub or gallop  Lab Results:   Recent Labs  03/21/13 1426  WBC 7.6  HGB 15.3  HCT 43.9  PLT 190   BMET  Recent Labs  03/21/13 1426  NA 143  K 4.8  CL 102  CO2 30  GLUCOSE 107*  BUN 7  CREATININE 0.96  CALCIUM 9.7   PT/INR No results found for this basename: LABPROT, INR,  in the last 72 hours  Studies/Results: Dg Chest Port 1 View  03/23/2013   CLINICAL DATA:  Spontaneous right pneumothorax.  EXAM: PORTABLE CHEST - 1 VIEW  COMPARISON:  03/22/2013  FINDINGS: Small caliber chest tube remains in place on the right. There is a persistent apical pneumothorax of approximately 10% volume. This is marginally larger than on the prior chest x-ray. No component of pleural fluid is identified. No underlying pulmonary lesion or consolidation is seen.  IMPRESSION: Residual roughly 10% right apical pneumothorax which is marginally larger than on the prior chest x-ray.   Electronically Signed   By: Aletta Edouard M.D.   On: 03/23/2013 08:21    Anti-infectives: Anti-infectives   None      Assessment/Plan: Impression: Spontaneous right pneumothorax with persistent right apical pneumothorax, air leak in chest tube Plan: Will contact thoracic surgery for further evaluation and possible treatment.  LOS: 3 days     Attikus Bartoszek A 03/24/2013

## 2013-03-25 ENCOUNTER — Inpatient Hospital Stay (HOSPITAL_COMMUNITY): Payer: Self-pay

## 2013-03-25 ENCOUNTER — Encounter (HOSPITAL_COMMUNITY)
Admission: EM | Disposition: A | Discharge status: Home / Self Care | Payer: Self-pay | Source: Home / Self Care | Attending: General Surgery

## 2013-03-25 DIAGNOSIS — J930 Spontaneous tension pneumothorax: Secondary | ICD-10-CM

## 2013-03-25 SURGERY — VIDEO ASSISTED THORACOSCOPY
Anesthesia: General | Site: Chest | Laterality: Right

## 2013-03-25 NOTE — Progress Notes (Signed)
NURSING PROGRESS NOTE  Allen Ward [id number] Transfer Data: 03/25/2013 4:11 AM Attending Provider: Jamesetta So, MD PCP:No PCP Per Patient Code Status: full code  Allen Ward is a 38 y.o. male patient transferred from [facility]  -No acute distress noted.  -No complaints of shortness of breath.  -No complaints of chest pain.   Cardiac Monitoring: Box # N/A in place. Cardiac monitor yields:N/A.  Blood pressure 138/85, pulse 72, temperature 98.4 F (36.9 C), temperature source Oral, resp. rate 18, height 6' 1.5" (1.867 m), weight 56.2 kg (123 lb 14.4 oz), SpO2 99.00%.   IV Fluids:  IV in place, occlusive dsg intact without redness, IV cath antecubital right, condition patent and no redness none.   Allergies:  Amoxicillin  Past Medical History:   has a past medical history of Hiatal hernia.  Past Surgical History:   has no past surgical history on file.  Social History:   reports that he has been smoking.  He does not have any smokeless tobacco history on file. He reports that he uses illicit drugs (Marijuana). He reports that he does not drink alcohol.  Skin: Intact ;with incision from rt.chest tube site clean & dry  Patient/Family orientated to room. Information packet given to patient/family. Admission inpatient armband information verified with patient/family to include name and date of birth and placed on patient arm. Side rails up x 2, fall assessment and education completed with patient/family. Patient/family able to verbalize understanding of risk associated with falls and verbalized understanding to call for assistance before getting out of bed. Call light within reach. Patient/family able to voice and demonstrate understanding of unit orientation instructions.    Will continue to evaluate and treat per MD orders.

## 2013-03-25 NOTE — H&P (Signed)
[address]       [place] [address]             [phone]             HISTORY AND PHYSICAL     Name: Allen Ward DOB: October 26, 1975 38 y.o. MRN: [id number]    Chief Complaint: Spontaneous right pneumothorax with persistent air leak   HPI: Allen Ward is a 38 y.o. male who presented to the ER at [facility] on 03/21/2013 complaining of right sided chest pain. Chest x-ray showed a 20% right pneumothorax.  He was seen by Dr. Arnoldo Morale, and a 20 Fr right chest tube was placed.  He was admitted and the chest tube was placed to suction. Despite conservative management, the chest tube has had a persistent air leak, and chest x-ray continues to show a right apical pneumothorax.  A thoracic surgery consult was requested and the patient was transferred to [facility] under Dr. Everrett Coombe service on 03/24/2013 for further evaluation and treatment. He denies any pain or shortness of breath at present.    Past History:  Past Medical History  Diagnosis Date  . Hiatal hernia     History reviewed. No pertinent past surgical history.  History   Social History  . Marital Status: Single    Spouse Name: N/A    Number of Children: N/A  . Years of Education: N/A   Social History Main Topics  . Smoking status: Current Some Day Smoker  . Smokeless tobacco: None  . Alcohol Use: No  . Drug Use: Yes    Special: Marijuana  . Sexual Activity: None   Other Topics Concern  . None   Social History Narrative  . None    History reviewed. No pertinent family history.  Allergies  Allergen Reactions  . Amoxicillin Hives and Shortness Of Breath    Prior to Admission medications   Not on File     Review of Systems:              Cardiac:   Chest Pain [  x  ]  Resting SOB [   ] Exertional SOB  [  ]  Orthopnea [  ]   Pedal Edema [   ]    Palpitations [  ] Syncope  [  ]   Presyncope [   ]  General: Constitional: recent weight change [  ]; anorexia [   ]; fatigue [  ]; nausea [  ]; night sweats [  ]; fever [  ]; or chills [  ];                                                                                                                                          Dental: poor dentition[  ]; Last Dentist visit:   Eye : blurred vision [  ];  diplopia [   ]; vision changes [  ];  Amaurosis fugax[  ]; Resp: cough [  ];  wheezing[  ];  hemoptysis[  ]; shortness of breath[  ]; paroxysmal nocturnal dyspnea[  ]; dyspnea on exertion[  ]; or orthopnea[  ];  GI:  gallstones[  ], vomiting[  ];  dysphagia[  ]; melena[  ];  hematochezia [  ]; heartburn[  ];   Hx of  Colonoscopy[  ]; GU: kidney stones [  ]; hematuria[  ];   dysuria [  ];  nocturia[  ];  history of     obstruction [  ];             Skin: rash, swelling[  ];, hair loss[  ];  peripheral edema[  ];  or itching[  ]; Musculosketetal: myalgias[  ];  joint swelling[  ];  joint erythema[  ];  joint pain[  ];  back pain[  ];  Heme/Lymph: bruising[  ];  bleeding[  ];  anemia[  ];  Neuro: TIA[  ];  headaches[  ];  stroke[  ];  vertigo[  ];  seizures[  ];   paresthesias[  ];  difficulty walking[  ];  Psych:depression[  ]; anxiety[  ];  Endocrine: diabetes[  ];  thyroid dysfunction[  ];  Immunizations: Flu [name]  ]; Pneumococcal[ n ];  Other:   Physical Exam: Filed Vitals:   03/25/13 0556  BP: 121/83  Pulse: 61  Temp: 97.9 F (36.6 C)  Resp: 16    General: No acute distress Heart: Regular rate and rhythm without murmurs, rubs, or gallops Lungs: Clear to auscultation, right sided pigtail drain in place Abdomen: Soft, nontender, nondistended.  Bowel sounds active in all quadrants. Neurologic: Grossly intact.  No obvious deficits. Extremities: Warm, well perfused, no edema Chest tube: No air leak      Labs: Lab Results  Component Value Date   WBC 8.7 03/24/2013   HGB 16.9 03/24/2013   HCT 46.2 03/24/2013   MCV 92.2 03/24/2013   PLT 180 03/24/2013   Lab Results  Component Value Date   INR 1.03  03/24/2013      Component Value Date/Time   NA 141 03/24/2013 2141   K 4.0 03/24/2013 2141   CL 98 03/24/2013 2141   CO2 27 03/24/2013 2141   GLUCOSE 130* 03/24/2013 2141   BUN 13 03/24/2013 2141   CREATININE 0.88 03/24/2013 2141   CALCIUM 9.8 03/24/2013 2141   GFRNONAA >90 03/24/2013 2141   GFRAA >90 03/24/2013 2141    Imaging: Dg Chest 2 View  03/24/2013   CLINICAL DATA:  Recent pneumothorax with right-sided chest tube  EXAM: CHEST  2 VIEW  COMPARISON:  Study obtained earlier in the day  FINDINGS: Chest tube remains in the right. The right apical pneumothorax is significantly smaller with only rather minimal apical pneumothorax on the right currently. Elsewhere lungs are clear. Heart size and pulmonary vascularity are normal. No adenopathy. No bone lesions.  IMPRESSION: There is now only minimal pneumothorax on the right with chest tube remaining on the right. Lungs clear.   Electronically Signed   By: Lowella Grip M.D.   On: 03/24/2013 22:51   Dg Chest Port 1 View  03/24/2013   CLINICAL DATA:  Followup pneumothorax.  EXAM: PORTABLE CHEST - 1 VIEW  COMPARISON:  Multiple previous chest x-rays.  FINDINGS: The cardiac silhouette, mediastinal and hilar contours are stable. The right-sided chest tube is unchanged. There is a persistent/unchanged  right apical pneumothorax. The left lung remains clear.  IMPRESSION: Stable right-sided chest tube with persistent apical pneumothorax.   Electronically Signed   By: Kalman Jewels M.D.   On: 03/24/2013 08:59     Assessment/Plan: The patient is a 38 year old male with no significant past medical history who presented to [facility] on 03/21/2013 with a spontaneous right pneumothorax.  He was treated with placement of a right pigtail drain and suction, but had a persistent air leak as well as a persistent right pneumothorax.  He was transferred to [facility] for thoracic surgery evaluation and possible VATS.  On repeat chest x-ray today, there does not appear to be a  residual right pneumothorax, and on exam, there is no air leak on water seal.  He will not require surgical intervention at this time.  We plan to continue the chest tube to water seal and repeat a PA and lateral chest x-ray in the morning to follow progress.    Burke Keels, PA-C 03/25/2013 11:30 AM  Patient seen and dx discussed. Since no air leak and lung inflated will wait on VATS. Patein et aware if he has recurrence would recommend VATS. I have seen and examined Lynnette Caffey and agree with the above assessment  and plan.  Grace Isaac MD Beeper [phone] Office [phone] 03/25/2013 2:15 PM

## 2013-03-26 ENCOUNTER — Inpatient Hospital Stay (HOSPITAL_COMMUNITY): Payer: Self-pay

## 2013-03-26 NOTE — Progress Notes (Addendum)
       [address]       [place] [address]             [phone]            Procedure(s) (LRB): VIDEO ASSISTED THORACOSCOPY (Right) VIDEO BRONCHOSCOPY (N/A)  Subjective: No complaints.  Objective: Vital signs in last 24 hours: Patient Vitals for the past 24 hrs:  BP Temp Temp src Pulse Resp SpO2  03/26/13 0629 145/84 mmHg 97.8 F (36.6 C) Oral 68 16 98 %  03/25/13 2259 112/69 mmHg 98.1 F (36.7 C) Oral 68 16 99 %  03/25/13 1328 110/74 mmHg 98.2 F (36.8 C) Oral 66 18 95 %   Current Weight  03/24/13 123 lb 14.4 oz (56.2 kg)     Intake/Output from previous day: 01/09 0701 - 01/10 0700 In: 240 [P.O.:240] Out: -     PHYSICAL EXAM:  Heart: RRR Lungs: Clear Chest tube: No air leak    Lab Results: CBC: Recent Labs  03/24/13 2141  WBC 8.7  HGB 16.9  HCT 46.2  PLT 180   BMET:  Recent Labs  03/24/13 2141  NA 141  K 4.0  CL 98  CO2 27  GLUCOSE 130*  BUN 13  CREATININE 0.88  CALCIUM 9.8    PT/INR:  Recent Labs  03/24/13 2141  LABPROT 13.3  INR 1.03    CXR: Cardiomediastinal silhouette is stable. Small bore right chest tube  is unchanged in position. There is increased right upper  pneumothorax measures about 5-7%. No acute infiltrate or pulmonary  edema.  IMPRESSION:  Increased right upper pneumothorax measures 5-7%. These results will  be called to the ordering clinician or representative by the  Radiologist Assistant, and communication documented in the PACS  Dashboard.   Assessment/Plan: S/P Procedure(s) (LRB): VIDEO ASSISTED THORACOSCOPY (Right) VIDEO BRONCHOSCOPY (N/A) CXR generally stable with CT on water seal and no air leak noted on exam.  Will d/c CT and watch closely.   LOS: 5 days    COLLINS,GINA H 03/26/2013  Patient seen and examined, agree with above Dc CT- CXR after removal and in AM

## 2013-03-26 NOTE — Progress Notes (Signed)
Chest tube removed per order w/out difficulty. 4x4 vaseline gauze applied to site. Patient tolerated well. Call bell near.Cindee Salt

## 2013-03-27 ENCOUNTER — Inpatient Hospital Stay (HOSPITAL_COMMUNITY): Payer: Self-pay

## 2013-03-27 NOTE — Discharge Summary (Signed)
[address]       [place] [address]             [phone]              Discharge Summary  Name: Allen Ward DOB: 1975/08/09 38 y.o. MRN: [id number]   Admission Date: 03/21/2013 Discharge Date:     Admitting Diagnosis: Spontaneous right pneumothorax   Discharge Diagnosis:  Spontaneous right pneumothorax Persistent air leak Past Medical History  Diagnosis Date  . Hiatal hernia        HPI:  The patient is a 38 y.o. male who presented to the ER at [facility] on 03/21/2013 complaining of right sided chest pain. Chest x-ray showed a 20% right pneumothorax. He was seen by Dr. Arnoldo Morale, and a 20 Fr right chest tube was placed. He was admitted and the chest tube was placed to suction. Despite conservative management, the chest tube has had a persistent air leak, and chest x-ray continues to show a right apical pneumothorax. A thoracic surgery consult was requested and the patient was transferred to [facility] under Dr. Everrett Coombe service on 03/24/2013 for further evaluation and treatment, including possible VATS.    Hospital Course:  The patient was admitted to [facility] on 03/21/2013. A chest x-ray on admission showed near resolution of his pneumothorax, and there was no air leak noted in the chest tube chamber. It was elected to treat this conservatively and wait on VATS.    His chest tube was monitored on water seal, and serial chest x-rays were obtained.  These remained stable, and his chest tube was able to be removed on 03/26/2013.  Follow up chest x-ray showed only a tiny apical pneumothorax, which has remained stable for 24 hours.  The patient has otherwise remained stable during this admission.  He has been evaluated on today's date and is ready for discharge home.     Recent vital signs:  Filed Vitals:   03/27/13 0538  BP: 121/76  Pulse: 70  Temp: 98 F (36.7 C)  Resp: 18    Recent laboratory studies:  CBC: Recent Labs   03/24/13 2141  WBC 8.7  HGB 16.9  HCT 46.2  PLT 180   BMET:  Recent Labs  03/24/13 2141  NA 141  K 4.0  CL 98  CO2 27  GLUCOSE 130*  BUN 13  CREATININE 0.88  CALCIUM 9.8    PT/INR:  Recent Labs  03/24/13 2141  LABPROT 13.3  INR 1.03     Discharge Medications:     Medication List    Notice   You have not been prescribed any medications.       Discharge Instructions:  The patient is to refrain from driving, heavy lifting or strenuous activity.  May shower daily and clean incisions with soap and water.  May resume regular diet.   Follow Up:       Future Appointments Provider Department Dept Phone   04/05/2013 9:15 AM Shanda Howells, MD [place] [phone]     Follow-up Information   Follow up with Shanda Howells On 04/05/2013. (9:15, be prepared to pay up front $150-200)    Contact information:   [place] [address] [phone]      Follow up with Grace Isaac, MD On 03/31/2013. (Office will contact you with an appointment.  )    Specialty:  Cardiothoracic Surgery   Contact information:   [phone]  [address] [place] [address] [place] 03/27/2013, 10:08 AM

## 2013-03-27 NOTE — Progress Notes (Signed)
03/27/13 Patient being discharged today.IV site removed, discharge instructions reviewed.

## 2013-03-27 NOTE — Progress Notes (Signed)
       [address]       [place] [address]             [phone]            Procedure(s) (LRB): VIDEO ASSISTED THORACOSCOPY (Right) VIDEO BRONCHOSCOPY (N/A)  Subjective: Stable, wants to go home.  Objective: Vital signs in last 24 hours: Patient Vitals for the past 24 hrs:  BP Temp Temp src Pulse Resp SpO2  03/27/13 0538 121/76 mmHg 98 F (36.7 C) Oral 70 18 99 %  03/26/13 2140 124/74 mmHg 98.6 F (37 C) Oral 62 18 98 %  03/26/13 1418 126/76 mmHg 98.6 F (37 C) Oral 75 20 97 %   Current Weight  03/24/13 123 lb 14.4 oz (56.2 kg)     Intake/Output from previous day: 01/10 0701 - 01/11 0700 In: 580 [P.O.:580] Out: -     PHYSICAL EXAM:  Heart: RRR Lungs: Clear    Lab Results: CBC: Recent Labs  03/24/13 2141  WBC 8.7  HGB 16.9  HCT 46.2  PLT 180   BMET:  Recent Labs  03/24/13 2141  NA 141  K 4.0  CL 98  CO2 27  GLUCOSE 130*  BUN 13  CREATININE 0.88  CALCIUM 9.8    PT/INR:  Recent Labs  03/24/13 2141  LABPROT 13.3  INR 1.03   CXR: FINDINGS:  Small pneumothorax at the right apex is no larger and possibly very  minimally smaller. Both lungs remain clear. No pleural fluid.  IMPRESSION:  No enlargement of the small right apical pneumothorax. Possible  slight decrease in size    Assessment/Plan: S/P Procedure(s) (LRB): VIDEO ASSISTED THORACOSCOPY (Right) VIDEO BRONCHOSCOPY (N/A) Plan discharge home. Will follow up in office later this week.   LOS: 6 days    Rudene Poulsen H 03/27/2013

## 2013-03-28 ENCOUNTER — Other Ambulatory Visit: Payer: Self-pay | Admitting: *Deleted

## 2013-03-28 DIAGNOSIS — J9383 Other pneumothorax: Secondary | ICD-10-CM

## 2013-03-30 ENCOUNTER — Ambulatory Visit
Admission: RE | Admit: 2013-03-30 | Discharge: 2013-03-30 | Disposition: A | Discharge status: Home / Self Care | Payer: Self-pay | Source: Ambulatory Visit | Attending: Cardiothoracic Surgery | Admitting: Cardiothoracic Surgery

## 2013-03-30 ENCOUNTER — Ambulatory Visit: Payer: Self-pay | Admitting: Cardiothoracic Surgery

## 2013-03-30 DIAGNOSIS — J9383 Other pneumothorax: Secondary | ICD-10-CM

## 2013-03-31 ENCOUNTER — Ambulatory Visit: Payer: Self-pay | Admitting: Cardiothoracic Surgery

## 2013-04-04 ENCOUNTER — Ambulatory Visit
Admission: RE | Admit: 2013-04-04 | Discharge: 2013-04-04 | Disposition: A | Discharge status: Home / Self Care | Payer: No Typology Code available for payment source | Source: Ambulatory Visit | Attending: Cardiothoracic Surgery | Admitting: Cardiothoracic Surgery

## 2013-04-04 ENCOUNTER — Encounter: Payer: Self-pay | Admitting: Cardiothoracic Surgery

## 2013-04-04 ENCOUNTER — Ambulatory Visit (INDEPENDENT_AMBULATORY_CARE_PROVIDER_SITE_OTHER): Payer: Self-pay | Admitting: Cardiothoracic Surgery

## 2013-04-04 ENCOUNTER — Other Ambulatory Visit: Payer: Self-pay | Admitting: *Deleted

## 2013-04-04 VITALS — BP 127/71 | HR 82 | Resp 20 | Ht 73.5 in | Wt 125.0 lb

## 2013-04-04 DIAGNOSIS — J939 Pneumothorax, unspecified: Secondary | ICD-10-CM

## 2013-04-04 DIAGNOSIS — J9382 Other air leak: Secondary | ICD-10-CM

## 2013-04-04 DIAGNOSIS — J9383 Other pneumothorax: Secondary | ICD-10-CM

## 2013-04-04 DIAGNOSIS — IMO0002 Reserved for concepts with insufficient information to code with codable children: Secondary | ICD-10-CM

## 2013-04-04 NOTE — Progress Notes (Signed)
      [address]       [place], [address]             [phone]                  Allen Ward [facility] Medical Record #[id number] Date of Birth: 01-Jun-1975  Referral: Dr. Arnoldo Morale No PCP Per Patient  Chief Complaint:   Post Chest tube  Follow Up Visit   History of Present Illness:      Patient had no recurrent shortness of breath or right chest pain after removal of his chest tube. He returns today for followup chest x-ray        History  Smoking status  . Current Some Day Smoker  Smokeless tobacco  . Not on file       Allergies  Allergen Reactions  . Amoxicillin Hives and Shortness Of Breath    No current outpatient prescriptions on file.   No current facility-administered medications for this visit.       Physical Exam: BP 127/71  Pulse 82  Resp 20  Ht 6' 1.5" (1.867 m)  Wt 125 lb (56.7 kg)  BMI 16.27 kg/m2  SpO2 98%  General appearance: alert and cooperative Neurologic: intact Heart: regular rate and rhythm, S1, S2 normal, no murmur, click, rub or gallop Lungs: clear to auscultation bilaterally Abdomen: soft, non-tender; bowel sounds normal; no masses,  no organomegaly Extremities: extremities normal, atraumatic, no cyanosis or edema and Homans sign is negative, no sign of DVT Wound: Chest tube site healed   Diagnostic Studies & Laboratory data:         Recent Radiology Findings: Dg Chest 2 View  04/04/2013   CLINICAL DATA:  History of right pneumothorax. Bilateral chest pain.  EXAM: CHEST  2 VIEW  COMPARISON:  03/30/2013.  FINDINGS: Trachea is midline. Heart size normal. Lungs are hyperinflated but clear. No pleural fluid. No pneumothorax.  IMPRESSION: Hyperinflation without acute finding.   Electronically Signed   By: Lorin Picket M.D.   On: 04/04/2013 16:01      Recent Labs: Lab Results  Component Value Date   WBC 8.7 03/24/2013   HGB 16.9 03/24/2013   HCT 46.2 03/24/2013   PLT 180 03/24/2013   GLUCOSE 130*  03/24/2013   ALT 12 03/24/2013   AST 14 03/24/2013   NA 141 03/24/2013   K 4.0 03/24/2013   CL 98 03/24/2013   CREATININE 0.88 03/24/2013   BUN 13 03/24/2013   CO2 27 03/24/2013   INR 1.03 03/24/2013      Assessment / Plan:     Patient doing well after chest tube placed at [facility]. Patient's had no evidence of recurrence. He is aware that there is some chance of recurrence since he's had a pneumothorax on the right previously. He has stopped smoking I recommended that he wait an additional 2 weeks before involved with heavy lifting or using a chain saw at work. Plan to see him back as needed       Allen Ward B 04/04/2013 4:17 PM

## 2013-04-04 NOTE — Patient Instructions (Signed)
No chain saw for 2 more weeks No heavy lifting (25lbs)  for two more weeks

## 2013-04-05 ENCOUNTER — Encounter: Payer: Self-pay | Admitting: Family Medicine

## 2013-04-05 ENCOUNTER — Ambulatory Visit (INDEPENDENT_AMBULATORY_CARE_PROVIDER_SITE_OTHER): Payer: Self-pay | Admitting: Family Medicine

## 2013-04-05 VITALS — BP 124/72 | HR 63 | Temp 97.1°F | Ht 73.5 in | Wt 133.0 lb

## 2013-04-05 DIAGNOSIS — Z09 Encounter for follow-up examination after completed treatment for conditions other than malignant neoplasm: Secondary | ICD-10-CM

## 2013-04-05 DIAGNOSIS — Z7189 Other specified counseling: Secondary | ICD-10-CM

## 2013-04-05 DIAGNOSIS — Z716 Tobacco abuse counseling: Secondary | ICD-10-CM

## 2013-04-05 DIAGNOSIS — Z8709 Personal history of other diseases of the respiratory system: Secondary | ICD-10-CM

## 2013-04-05 NOTE — Progress Notes (Signed)
New Patient History and Physical  Patient name: Allen Ward Medical record number: [id number] Date of birth: 24-Oct-1975 Age: 38 y.o. Gender: male  Primary Care Provider: Redge Gainer, MD  Chief Complaint: Hospital follow up  History of Present Illness: The patient presents today for hospital followup. Patient is noted to have been admitted on January 5 for a right spontaneous pneumothorax. A right chest tube was placed in the ER with subsequent falling of patient's clinical symptoms over the course of 5-6 days. There was a question of whether the patient needed a vast procedure as there was a question of air leak on the chest you. Patient was followed by cardiothoracic surgery otherwise conservative management and improving clinical course. Patient was seen by cardiothoracic surgery yesterday. Followup chest x-ray yesterday showed no pneumothorax. Recommendations were no aggressive movements over the next 2 weeks. Overall doing well. Patient does report having some mild nighttime right-sided chest pain. Is not taking any Tylenol or NSAIDs for this. Patient states he has quit smoking. Denies any shortness activity.   Past Medical History: Patient Active Problem List   Diagnosis Date Noted  . Pneumothorax 03/21/2013   Past Medical History  Diagnosis Date  . Hiatal hernia     Past Surgical History: History reviewed. No pertinent past surgical history.  Social History: History   Social History  . Marital Status: Single    Spouse Name: N/A    Number of Children: N/A  . Years of Education: N/A   Social History Main Topics  . Smoking status: Never Smoker   . Smokeless tobacco: Never Used  . Alcohol Use: No  . Drug Use: Yes    Special: Marijuana  . Sexual Activity: None   Other Topics Concern  . None   Social History Narrative  . None    Family History: Family History  Problem Relation Age of Onset  . Osteoporosis Mother   . Heart disease Mother   . Heart  attack Father   . Heart disease Father   . Obesity Sister   . Hypertension Sister   . Diabetes Sister   . Cancer Sister     Allergies: Allergies  Allergen Reactions  . Amoxicillin Hives and Shortness Of Breath    No current outpatient prescriptions on file.   No current facility-administered medications for this visit.   Review Of Systems: 12 point ROS negative except as noted above in HPI.  Physical Exam: Filed Vitals:   04/05/13 0921  BP: 124/72  Pulse: 63  Temp: 97.1 F (36.2 C)    General: alert, cooperative and thin  HEENT: PERRLA and extra ocular movement intact Heart: S1, S2 normal, no murmur, rub or gallop, regular rate and rhythm Lungs: clear to auscultation, no wheezes or rales and unlabored breathing Abdomen: abdomen is soft without significant tenderness, masses, organomegaly or guarding Extremities: extremities normal, atraumatic, no cyanosis or edema Skin:no rashes Neurology: normal without focal findings  Labs and Imaging: Lab Results  Component Value Date/Time   NA 141 03/24/2013  9:41 PM   K 4.0 03/24/2013  9:41 PM   CL 98 03/24/2013  9:41 PM   CO2 27 03/24/2013  9:41 PM   BUN 13 03/24/2013  9:41 PM   CREATININE 0.88 03/24/2013  9:41 PM   GLUCOSE 130* 03/24/2013  9:41 PM   Lab Results  Component Value Date   WBC 8.7 03/24/2013   HGB 16.9 03/24/2013   HCT 46.2 03/24/2013   MCV 92.2 03/24/2013   PLT 180  03/24/2013    Dg Chest 2 View  04/04/2013   CLINICAL DATA:  History of right pneumothorax. Bilateral chest pain.  EXAM: CHEST  2 VIEW  COMPARISON:  03/30/2013.  FINDINGS: Trachea is midline. Heart size normal. Lungs are hyperinflated but clear. No pleural fluid. No pneumothorax.  IMPRESSION: Hyperinflation without acute finding.   Electronically Signed   By: Lorin Picket M.D.   On: 04/04/2013 16:01     Assessment and Plan: Hospital discharge follow-up  Hx of pneumothorax  Overall doing well. Assessment well room air with no respiratory  distress. Recommend when necessary NSAIDs for nighttime pain as in her chest wall heels. Discussed smoking cessation. Discussed respiratory and cardiothoracic red flags with patient. Follow up as needed.           Shanda Howells MD

## 2013-11-21 ENCOUNTER — Emergency Department (HOSPITAL_COMMUNITY)
Admission: EM | Admit: 2013-11-21 | Discharge: 2013-11-21 | Disposition: A | Discharge status: Home / Self Care | Payer: Self-pay | Attending: Emergency Medicine | Admitting: Emergency Medicine

## 2013-11-21 ENCOUNTER — Encounter (HOSPITAL_COMMUNITY): Payer: Self-pay | Admitting: Emergency Medicine

## 2013-11-21 DIAGNOSIS — K089 Disorder of teeth and supporting structures, unspecified: Secondary | ICD-10-CM | POA: Insufficient documentation

## 2013-11-21 DIAGNOSIS — Z8709 Personal history of other diseases of the respiratory system: Secondary | ICD-10-CM | POA: Insufficient documentation

## 2013-11-21 DIAGNOSIS — K029 Dental caries, unspecified: Secondary | ICD-10-CM | POA: Insufficient documentation

## 2013-11-21 DIAGNOSIS — Z880 Allergy status to penicillin: Secondary | ICD-10-CM | POA: Insufficient documentation

## 2013-11-21 DIAGNOSIS — K047 Periapical abscess without sinus: Secondary | ICD-10-CM | POA: Insufficient documentation

## 2013-11-21 HISTORY — DX: Pneumothorax, unspecified: J93.9

## 2013-11-21 MED ORDER — MORPHINE SULFATE 4 MG/ML IJ SOLN
4.0000 mg | Freq: Once | INTRAMUSCULAR | Status: AC
  Administered 2013-11-21: 4 mg via INTRAVENOUS
  Filled 2013-11-21: qty 1

## 2013-11-21 MED ORDER — NAPROXEN 500 MG PO TABS: 500.0000 mg | ORAL_TABLET | Freq: Two times a day (BID) | ORAL | Status: DC

## 2013-11-21 MED ORDER — HYDROCODONE-ACETAMINOPHEN 5-325 MG PO TABS: 1.0000 | ORAL_TABLET | ORAL | Status: DC | PRN

## 2013-11-21 MED ORDER — CLINDAMYCIN PHOSPHATE 600 MG/50ML IV SOLN
600.0000 mg | Freq: Once | INTRAVENOUS | Status: AC
  Administered 2013-11-21: 600 mg via INTRAVENOUS
  Filled 2013-11-21: qty 50

## 2013-11-21 MED ORDER — CLINDAMYCIN HCL 300 MG PO CAPS: 300.0000 mg | ORAL_CAPSULE | Freq: Four times a day (QID) | ORAL | Status: DC

## 2013-11-21 NOTE — ED Provider Notes (Signed)
CSN: [id number]     Arrival date & time 11/21/13  1420 History  This chart was scribed for Dorie Rank, MD by Lowella Petties, ED Scribe. The patient was seen in room APA09/APA09. Patient's care was started at 3:19 PM.   Chief Complaint  Patient presents with  . Dental Pain   The history is provided by the patient. No language interpreter was used.   HPI Comments: Allen Ward is a 38 y.o. male who presents to the Emergency Department complaining of severe right sided dental pain. He reports that the pain began yesterday, and today he woke up with facial swelling. He reports drainage of pus inside his mouth and increased salivation. Patient reports allergies to amoxicillin and codeine.   No difficulty swallowing or speaking.  OTC pain meds and topical treatments have not helped.  Past Medical History  Diagnosis Date  . Hiatal hernia   . Pneumothorax    History reviewed. No pertinent past surgical history. Family History  Problem Relation Age of Onset  . Osteoporosis Mother   . Heart disease Mother   . Heart attack Father   . Heart disease Father   . Obesity Sister   . Hypertension Sister   . Diabetes Sister   . Cancer Sister    History  Substance Use Topics  . Smoking status: Never Smoker   . Smokeless tobacco: Never Used  . Alcohol Use: No    Review of Systems  HENT: Positive for dental problem.   All other systems reviewed and are negative.  Allergies  Amoxicillin; Codeine; and Penicillins  Home Medications   Prior to Admission medications   Medication Sig Start Date End Date Taking? Authorizing Provider  clindamycin (CLEOCIN) 300 MG capsule Take 1 capsule (300 mg total) by mouth 4 (four) times daily. 11/21/13   Dorie Rank, MD  HYDROcodone-acetaminophen (NORCO/VICODIN) 5-325 MG per tablet Take 1-2 tablets by mouth every 4 (four) hours as needed. 11/21/13   Dorie Rank, MD  naproxen (NAPROSYN) 500 MG tablet Take 1 tablet (500 mg total) by mouth 2 (two) times daily. 11/21/13    Dorie Rank, MD   Triage Vitals: BP 125/75  Pulse 66  Temp(Src) 99.8 F (37.7 C) (Oral)  Resp 18  Ht 6\' 1"  (1.854 m)  Wt 133 lb (60.328 kg)  BMI 17.55 kg/m2  SpO2 99% Physical Exam  Nursing note and vitals reviewed. Constitutional: He appears well-developed and well-nourished. No distress.  HENT:  Head: Normocephalic and atraumatic.  Right Ear: External ear normal.  Left Ear: External ear normal.  Mouth/Throat: There is trismus in the jaw. Dental abscesses and dental caries present. No uvula swelling. No posterior oropharyngeal edema, posterior oropharyngeal erythema or tonsillar abscesses.    Right sided submandibular lymph node swelling. Pain with mild trismus.   Eyes: Conjunctivae are normal. Right eye exhibits no discharge. Left eye exhibits no discharge. No scleral icterus.  Neck: Neck supple. No tracheal deviation present.  Cardiovascular: Normal rate.   Pulmonary/Chest: Effort normal. No stridor. No respiratory distress.  Musculoskeletal: He exhibits no edema.  Neurological: He is alert. Cranial nerve deficit: no gross deficits.  Skin: Skin is warm and dry. No rash noted.  Psychiatric: He has a normal mood and affect.    ED Course  Procedures (including critical care time) DIAGNOSTIC STUDIES: Oxygen Saturation is 99% on room air, normal by my interpretation.    COORDINATION OF CARE: 3:23 PM-Discussed treatment plan which includes morphine and clindamycin with pt at bedside and pt  agreed to plan.   MDM   Final diagnoses:  Dental abscess   Pt does not have a significant amount of swelling at this time.  He does have some pain with opening his mouth but is able to open fully.  No airway compromise.  IV abx in the ED.  Dc home with abx and pain meds.  Will need close dental follow up.  I personally performed the services described in this documentation, which was scribed in my presence.  The recorded information has been reviewed and is accurate.   Dorie Rank,  MD 11/21/13 [phone]

## 2013-11-21 NOTE — ED Notes (Signed)
Dental pain began yesterday. Swelling began this morning. Pt states drooling.

## 2013-11-21 NOTE — Discharge Instructions (Signed)
Dental Abscess °A dental abscess is a collection of infected fluid (pus) from a bacterial infection in the inner part of the tooth (pulp). It usually occurs at the end of the tooth's root.  °CAUSES  °· Severe tooth decay. °· Trauma to the tooth that allows bacteria to enter into the pulp, such as a broken or chipped tooth. °SYMPTOMS  °· Severe pain in and around the infected tooth. °· Swelling and redness around the abscessed tooth or in the mouth or face. °· Tenderness. °· Pus drainage. °· Bad breath. °· Bitter taste in the mouth. °· Difficulty swallowing. °· Difficulty opening the mouth. °· Nausea. °· Vomiting. °· Chills. °· Swollen neck glands. °DIAGNOSIS  °· A medical and dental history will be taken. °· An examination will be performed by tapping on the abscessed tooth. °· X-rays may be taken of the tooth to identify the abscess. °TREATMENT °The goal of treatment is to eliminate the infection. You may be prescribed antibiotic medicine to stop the infection from spreading. A root canal may be performed to save the tooth. If the tooth cannot be saved, it may be pulled (extracted) and the abscess may be drained.  °HOME CARE INSTRUCTIONS °· Only take over-the-counter or prescription medicines for pain, fever, or discomfort as directed by your caregiver. °· Rinse your mouth (gargle) often with salt water (¼ tsp salt in 8 oz [250 ml] of warm water) to relieve pain or swelling. °· Do not drive after taking pain medicine (narcotics). °· Do not apply heat to the outside of your face. °· Return to your dentist for further treatment as directed. °SEEK MEDICAL CARE IF: °· Your pain is not helped by medicine. °· Your pain is getting worse instead of better. °SEEK IMMEDIATE MEDICAL CARE IF: °· You have a fever or persistent symptoms for more than 2-3 days. °· You have a fever and your symptoms suddenly get worse. °· You have chills or a very bad headache. °· You have problems breathing or swallowing. °· You have trouble  opening your mouth. °· You have swelling in the neck or around the eye. °Document Released: 03/03/2005 Document Revised: 11/26/2011 Document Reviewed: 06/11/2010 °ExitCare® Patient Information ©2015 [employer]. This information is not intended to replace advice given to you by your health care provider. Make sure you discuss any questions you have with your health care provider. ° ° °Emergency Department Resource Guide °1) Find a Doctor and Pay Out of Pocket °Although you won't have to find out who is covered by your insurance plan, it is a good idea to ask around and get recommendations. You will then need to call the office and see if the doctor you have chosen will accept you as a new patient and what types of options they offer for patients who are self-pay. Some doctors offer discounts or will set up payment plans for their patients who do not have insurance, but you will need to ask so you aren't surprised when you get to your appointment. ° °2) Contact Your Local Health Department °Not all health departments have doctors that can see patients for sick visits, but many do, so it is worth a call to see if yours does. If you don't know where your local health department is, you can check in your phone book. The CDC also has a tool to help you locate your state's health department, and many state websites also have listings of all of their local health departments. ° °3) Find a Walk-in   Clinic °If your illness is not likely to be very severe or complicated, you may want to try a walk in clinic. These are popping up all over the country in pharmacies, drugstores, and shopping centers. They're usually staffed by nurse practitioners or physician assistants that have been trained to treat common illnesses and complaints. They're usually fairly quick and inexpensive. However, if you have serious medical issues or chronic medical problems, these are probably not your best option. ° °No Primary Care Doctor: °- Call  [facility] at  832-8000 - they can help you locate a primary care doctor that  accepts your insurance, provides certain services, etc. °- Physician Referral Service- 1-800-533-3463 ° °Chronic Pain Problems: °Organization         Address  Phone   Notes  ° [facility]  (336) 297-2271 Patients need to be referred by their primary care doctor.  ° °Medication Assistance: °Organization         Address  Phone   Notes  °[place] [facility] [address] °[place], [place] [address] (336) 641-8030 --Must be a resident of [place] °-- Must have NO insurance coverage whatsoever (no Medicaid/ Medicare, etc.) °-- The pt. MUST have a primary care doctor that directs their care regularly and follows them in the community °  °[facility]  (866) 331-1348   °[employer]  (888) 892-1162   ° °Agencies that provide inexpensive medical care: °Organization         Address  Phone   Notes  °[facility]  (336) 832-8035   °[facility]    (336) 832-7272   °[facility] [address] °[place], [place] [address] (336) 832-4777   °[facility] [address], °[place] (336) 271-4999   °[facility]    (336) 373-0678   °[facility]    (336) 272-1050   °[facility] ° [address], [place] Phone:  (336) 832-4444, Fax:  (336) 832-4440 Hours of Operation:  9 am - 6 pm, M-F.  Also accepts Medicaid/Medicare and self-pay.  °[facility] ° [address], [place] Phone: (336) 832-3150, Fax: (336) 832-3151. Hours of Operation:  8:30 am - 5:30 pm, M-F.  Also accepts Medicaid and self-pay.  °[facility] [place] [address], [place] Phone: (336) 878-6027   °[facility] [address], [place], [place] (336)723-1848, Ext. 123 Mondays & Thursdays: 7-9 AM.  First 15 patients are seen on a first come, first serve  basis. °  ° °Medicaid-accepting [place] Providers: ° °Organization         Address  Phone   Notes  °[facility] [address], [place] (336) 641-2100 Also accepts self-pay patients.  °[facility] [address], [place] ° (336) 856-9996   °[facility] [address], [place] (336) 288-8857   °[facility] [address] [place] [address], [place] (336) 299-7000   °Veita Bland [address], [place]  ° (336) 373-1557 Only accepts [place] Access Medicaid patients after they have their name applied to their card.  ° °Self-Pay (no insurance) in [place]: ° °Organization         Address  Phone   Notes  °Sickle Cell Patients, [facility] [address], [place] (  336) 832-1970   °[facility] [address], [place] (336) 832-4400   ° [facility] ° [address], [place] (336) 992-4800   °[facility]/Dr. Osei-Bonsu ° [address] [place] [address], [place] or [address], [place] (336) [phone] Phone number for both [place] and [place] locations is the same.  °[facility] [address], [place] (336) 299-0000   °[facility] [address] [place] [address], [place] or [address] (336) 852-7530 °(336) 878-2260   °[facility] [address], [place] (336) 350-1642, phone; (336) 294-5005, fax Sees patients 1st and 3rd Saturday of every month.  Must not qualify for public or private insurance (i.e. Medicaid, Medicare, [place] Health Choice, Veterans' Benefits) • Household income should be no more than 200% of the poverty level •The clinic cannot treat you if you are pregnant or think you are pregnant • Sexually transmitted diseases are not treated at the clinic.  ° ° °Dental Care: °Organization         Address  Phone  Notes  °[place] [address], [place] (336) 641-6152 Accepts children up to age 21 who are enrolled in Medicaid or [place] Health Choice; pregnant women with a Medicaid card; and children who have applied for Medicaid or [place] Health Choice, but were declined, whose parents can pay a reduced fee at time of service.  °[facility] [place]  [address], [place] (336) 641-7733 Accepts children up to age 21 who are enrolled in Medicaid or [place] Health Choice; pregnant women with a Medicaid card; and children who have applied for Medicaid or [place] Health Choice, but were declined, whose parents can pay a reduced fee at time of service.  °[facility] ° [address], [place] (336) 641-4533 Patients are seen by appointment only. Walk-ins are not accepted. [facility] will see patients 18 years of age and older. °Monday - Tuesday (8am-5pm) °Most Wednesdays (8:30-5pm) °$30 per visit, cash only  °[facility] ° [address], [place] (336) 641-4533 Patients are seen by appointment only. Walk-ins are not accepted. [facility] will see patients 18 years of age and older. °One Wednesday Evening (Monthly: Volunteer Based).  $30 per visit, cash only  °[facility]  (919) 537-3737 for adults; Children under age 4, call Graduate Pediatric Dentistry at (919) [phone]. Children aged 4-14, please call (919) [phone] to request a pediatric application. ° Dental services are provided in all areas of dental care including fillings, crowns and bridges, complete and partial dentures, implants, gum treatment, root canals, and extractions. Preventive care is also provided. Treatment is provided to both adults and children. °Patients are selected via a lottery and there is often a waiting list. °  °[facility] [address], °[place] ° (336) 763-8833  [email or web address] °  °[facility] [address], [place], [place] (336)723-1848, Ext. 123 Second and Fourth Thursday of each month, opens at 6:30 AM; Clinic ends at 9 AM.  Patients are seen on a first-come first-served basis, and a limited number are seen during each clinic.  ° °[facility] ° [address], [place], [place] (336) 723-7904   Eligibility Requirements °You must have lived in [place], [place], or [place] counties   for at least the last three months. °  You cannot be eligible for state or federal sponsored healthcare insurance, including Veterans Administration, Medicaid, or Medicare. °  You generally cannot be eligible for healthcare insurance through your employer.  °  How to apply: °Eligibility screenings are held every Tuesday and Wednesday afternoon from 1:00 pm until 4:00 pm. You do not need an appointment for the interview!  °[facility] [address], [place], [place] 336-631-2330   °[facility]  336-342-8273   °[facility]  336-703-3100   °[facility]  336-570-6415   ° °Behavioral Health Resources in the Community: °Intensive Outpatient Programs °Organization         Address  Phone  Notes  °[facility] [address], [place], [place] 336-878-6098   °[facility] [address], [place], [place] 336-832-9800   °[facility] [address], [place], [place] ° 336-882-2125   °[facility] [address],  °[place], [place] 1-800-853-5163 or 336-641-4981   °Substance Abuse Resources °Organization         Address  Phone  Notes  °[facility]  336-882-2125   °[facility]  336-784-9470   °[facility]  336-285-9073   °[facility]  336-845-3988   °Residential & Outpatient Substance Abuse Program  1-800-659-3381   °Psychological Services °Organization          Address  Phone  Notes  °[facility]  336- 832-9600   °[facility]  336- 378-7881   °[facility] [address], [place] 1-800-853-5163 or 336-641-4981   ° °Mobile Crisis Teams °Organization         Address  Phone  Notes  °[facility], Mobile Crisis Care Unit  1-877-626-1772   °Assertive °[facility] ° [address] [place], [place] 336-834-9664   °Sharon DeEsch [address] °[place] 336-554-5454   ° °Self-Help/Support Groups °Organization         Address  Phone             Notes  °[facility] - variety of support groups  336- 373-1402 Call for more information  °Narcotics Anonymous (NA), [facility] [address], °[place]  2 meetings at this location  ° °Residential Treatment Programs °Organization         Address  Phone  Notes  °[facility] [address],    °[place]  1-866-801-8205   °[facility] ° [address], [place], [place] 704-293-8524   °[facility] [address], [place] 336-845-3988 Admissions: 8am-3pm M-F  °[facility] [address]    °[place], [place] 336-841-1104   °[facility] [address], [place], [place] 336-379-7146   °[facility] [address]  °[place], [place] 336-285-9073   °[facility] - Intensive Outpatient [address], [place], [place] 336-852-3033   °[facility] [address]  °[place], [place] 1-877-615-2722 or 336-784-9470   °[facility] [address] [place], [place] 336-227-7417 Accepts Medicaid  °[facility] [address]  °[place]  1-800-659-3381 Substance Abuse/Addiction Treatment  ° °[place] Behavioral Health Resources °Organization         Address  Phone  Notes  °[facility]  (888)   581-9988   °Julie Brannon, PhD [address] [place], [place]   (336) 349-5553 or (336) 951-0000   °[facility]   [address] °[place], [place] (336) 349-4454   °[facility] [address], [place], [place] (336) 342-8316 Insurance/Medicaid/sponsorship through [facility]  °[facility] [address]                                    [place], [place] (336) 342-8316 Therapy/tele-psych/case  °[facility] [address]  ° [place], [place] (336) 349-2233    °Dr. Arfeen  (336) 349-4544   °[facility] [place]  [employer] [place] [facility] 1) [address], [place] °2) [address], [place] °3)  [address], [place] (336) 349-3220 °(336) 342-7768 ° °(336) 342-8140   °[place] [facility] (336) 342-1394 or (336) 342-3537 (After Hours)    ° ° ° °

## 2013-11-21 NOTE — ED Notes (Signed)
EDP assessment prior to nursing assessment.

## 2013-11-27 ENCOUNTER — Emergency Department (HOSPITAL_COMMUNITY): Payer: Self-pay

## 2013-11-27 ENCOUNTER — Encounter (HOSPITAL_COMMUNITY): Payer: Self-pay | Admitting: Emergency Medicine

## 2013-11-27 ENCOUNTER — Emergency Department (HOSPITAL_COMMUNITY)
Admission: EM | Admit: 2013-11-27 | Discharge: 2013-11-27 | Disposition: A | Discharge status: Home / Self Care | Payer: Self-pay | Attending: Emergency Medicine | Admitting: Emergency Medicine

## 2013-11-27 DIAGNOSIS — Z8709 Personal history of other diseases of the respiratory system: Secondary | ICD-10-CM | POA: Insufficient documentation

## 2013-11-27 DIAGNOSIS — Z791 Long term (current) use of non-steroidal anti-inflammatories (NSAID): Secondary | ICD-10-CM | POA: Insufficient documentation

## 2013-11-27 DIAGNOSIS — Z792 Long term (current) use of antibiotics: Secondary | ICD-10-CM | POA: Insufficient documentation

## 2013-11-27 DIAGNOSIS — Z8719 Personal history of other diseases of the digestive system: Secondary | ICD-10-CM | POA: Insufficient documentation

## 2013-11-27 DIAGNOSIS — R1084 Generalized abdominal pain: Secondary | ICD-10-CM | POA: Insufficient documentation

## 2013-11-27 DIAGNOSIS — R112 Nausea with vomiting, unspecified: Secondary | ICD-10-CM | POA: Insufficient documentation

## 2013-11-27 DIAGNOSIS — R1110 Vomiting, unspecified: Secondary | ICD-10-CM

## 2013-11-27 DIAGNOSIS — Z880 Allergy status to penicillin: Secondary | ICD-10-CM | POA: Insufficient documentation

## 2013-11-27 LAB — COMPREHENSIVE METABOLIC PANEL
ALT: 9 U/L (ref 0–53)
AST: 14 U/L (ref 0–37)
Albumin: 4.7 g/dL (ref 3.5–5.2)
Alkaline Phosphatase: 69 U/L (ref 39–117)
Anion gap: 11 (ref 5–15)
BUN: 11 mg/dL (ref 6–23)
CALCIUM: 9.9 mg/dL (ref 8.4–10.5)
CO2: 29 meq/L (ref 19–32)
Chloride: 99 mEq/L (ref 96–112)
Creatinine, Ser: 0.86 mg/dL (ref 0.50–1.35)
GFR calc Af Amer: >90 mL/min (ref >90)
GLUCOSE: 101 mg/dL — AB (ref 70–99)
Potassium: 4.2 mEq/L (ref 3.7–5.3)
SODIUM: 139 meq/L (ref 137–147)
Total Bilirubin: 0.8 mg/dL (ref 0.3–1.2)
Total Protein: 7.9 g/dL (ref 6.0–8.3)

## 2013-11-27 LAB — CBC WITH DIFFERENTIAL/PLATELET
Basophils Absolute: 0 10*3/uL (ref 0.0–0.1)
Basophils Relative: 0 % (ref 0–1)
EOS PCT: 0 % (ref 0–5)
Eosinophils Absolute: 0 10*3/uL (ref 0.0–0.7)
HCT: 42.8 % (ref 39.0–52.0)
HEMOGLOBIN: 15.4 g/dL (ref 13.0–17.0)
LYMPHS ABS: 0.9 10*3/uL (ref 0.7–4.0)
LYMPHS PCT: 13 % (ref 12–46)
MCH: 32.9 pg (ref 26.0–34.0)
MCHC: 36 g/dL (ref 30.0–36.0)
MCV: 91.5 fL (ref 78.0–100.0)
MONOS PCT: 5 % (ref 3–12)
Monocytes Absolute: 0.4 10*3/uL (ref 0.1–1.0)
Neutro Abs: 5.5 10*3/uL (ref 1.7–7.7)
Neutrophils Relative %: 82 % — ABNORMAL HIGH (ref 43–77)
Platelets: 205 10*3/uL (ref 150–400)
RBC: 4.68 MIL/uL (ref 4.22–5.81)
RDW: 11.9 % (ref 11.5–15.5)
WBC: 6.8 10*3/uL (ref 4.0–10.5)

## 2013-11-27 LAB — I-STAT CG4 LACTIC ACID, ED: Lactic Acid, Venous: 1.44 mmol/L (ref 0.5–2.2)

## 2013-11-27 LAB — LIPASE, BLOOD: Lipase: 49 U/L (ref 11–59)

## 2013-11-27 MED ORDER — AZITHROMYCIN 250 MG PO TABS: 250.0000 mg | ORAL_TABLET | Freq: Every day | ORAL | Status: DC

## 2013-11-27 MED ORDER — HYDROMORPHONE HCL PF 1 MG/ML IJ SOLN
1.0000 mg | Freq: Once | INTRAMUSCULAR | Status: AC
  Administered 2013-11-27: 1 mg via INTRAVENOUS
  Filled 2013-11-27: qty 1

## 2013-11-27 MED ORDER — PROMETHAZINE HCL 25 MG PO TABS: 25.0000 mg | ORAL_TABLET | Freq: Four times a day (QID) | ORAL | Status: DC | PRN

## 2013-11-27 MED ORDER — SODIUM CHLORIDE 0.9 % IV BOLUS (SEPSIS)
1000.0000 mL | Freq: Once | INTRAVENOUS | Status: AC
  Administered 2013-11-27: 1000 mL via INTRAVENOUS

## 2013-11-27 MED ORDER — ONDANSETRON HCL 4 MG/2ML IJ SOLN
4.0000 mg | Freq: Once | INTRAMUSCULAR | Status: AC
  Administered 2013-11-27: 4 mg via INTRAVENOUS
  Filled 2013-11-27: qty 2

## 2013-11-27 NOTE — ED Notes (Signed)
Pt with continued emesis since last time seen here on 9/7 for tooth infection, pt not able to take any meds and keep down

## 2013-11-27 NOTE — Discharge Instructions (Signed)

## 2013-11-27 NOTE — ED Provider Notes (Signed)
CSN: [id number]     Arrival date & time 11/27/13  1447 History   First MD Initiated Contact with Patient 11/27/13 1516     Chief Complaint  Patient presents with  . Emesis     (Consider location/radiation/quality/duration/timing/severity/associated sxs/prior Treatment) HPI Comments: Patient presents to the ER for evaluation of abdominal pain with vomiting. Patient reports that he was seen several days ago in the ER for dental pain. He was started on pain medication and antibiotic. Since starting any medications, however, patient has had nausea and vomiting. Patient reports diffuse abdominal pain which is constant, crampy and sharp in nature. Patient reports that he has not been able to take his medications were old any solids or liquids down because of the vomiting.  Patient is a 38 y.o. male presenting with vomiting.  Emesis Associated symptoms: abdominal pain     Past Medical History  Diagnosis Date  . Hiatal hernia   . Pneumothorax    History reviewed. No pertinent past surgical history. Family History  Problem Relation Age of Onset  . Osteoporosis Mother   . Heart disease Mother   . Heart attack Father   . Heart disease Father   . Obesity Sister   . Hypertension Sister   . Diabetes Sister   . Cancer Sister    History  Substance Use Topics  . Smoking status: Never Smoker   . Smokeless tobacco: Never Used  . Alcohol Use: No    Review of Systems  Gastrointestinal: Positive for nausea, vomiting and abdominal pain.  All other systems reviewed and are negative.     Allergies  Amoxicillin; Codeine; and Penicillins  Home Medications   Prior to Admission medications   Medication Sig Start Date End Date Taking? Authorizing Provider  clindamycin (CLEOCIN) 300 MG capsule Take 1 capsule (300 mg total) by mouth 4 (four) times daily. 11/21/13   Dorie Rank, MD  HYDROcodone-acetaminophen (NORCO/VICODIN) 5-325 MG per tablet Take 1-2 tablets by mouth every 4 (four) hours as  needed. 11/21/13   Dorie Rank, MD  naproxen (NAPROSYN) 500 MG tablet Take 1 tablet (500 mg total) by mouth 2 (two) times daily. 11/21/13   Dorie Rank, MD   BP 121/68  Pulse 55  Temp(Src) 97.8 F (36.6 C) (Oral)  Resp 16  Ht 6\' 4"  (1.93 m)  Wt 133 lb (60.328 kg)  BMI 16.20 kg/m2  SpO2 100% Physical Exam  Constitutional: He is oriented to person, place, and time. He appears well-developed and well-nourished. No distress.  HENT:  Head: Normocephalic and atraumatic.  Right Ear: Hearing normal.  Left Ear: Hearing normal.  Nose: Nose normal.  Mouth/Throat: Oropharynx is clear and moist and mucous membranes are normal.  Eyes: Conjunctivae and EOM are normal. Pupils are equal, round, and reactive to light.  Neck: Normal range of motion. Neck supple.  Cardiovascular: Regular rhythm, S1 normal and S2 normal.  Exam reveals no gallop and no friction rub.   No murmur heard. Pulmonary/Chest: Effort normal and breath sounds normal. No respiratory distress. He exhibits no tenderness.  Abdominal: Soft. Normal appearance and bowel sounds are normal. There is no hepatosplenomegaly. There is generalized tenderness. There is no rebound, no guarding, no tenderness at McBurney's point and negative Murphy's sign. No hernia.  Musculoskeletal: Normal range of motion.  Neurological: He is alert and oriented to person, place, and time. He has normal strength. No cranial nerve deficit or sensory deficit. Coordination normal. GCS eye subscore is 4. GCS verbal subscore is 5. GCS  motor subscore is 6.  Skin: Skin is warm, dry and intact. No rash noted. No cyanosis.  Psychiatric: He has a normal mood and affect. His speech is normal and behavior is normal. Thought content normal.    ED Course  Procedures (including critical care time) Labs Review Labs Reviewed  CBC WITH DIFFERENTIAL  COMPREHENSIVE METABOLIC PANEL  LIPASE, BLOOD  URINALYSIS, ROUTINE W REFLEX MICROSCOPIC  URINE RAPID DRUG SCREEN (HOSP PERFORMED)   I-STAT CG4 LACTIC ACID, ED    Imaging Review No results found.   EKG Interpretation None      MDM   Final diagnoses:  None    Patient presents to the ER for evaluation of nausea, vomiting and diffuse abdominal pain. Symptoms began after starting antibiotics for a toothache. He reports only taking one of the hydrocodone tablets, it is unlikely that the pain medication initiated his symptoms. Patient reports that his headache is gone. He had diffuse tenderness, no concerning focal findings. Blood work is normal. Patient has had resolution of symptoms after treatment here in the ER. I recommended stopping antibiotics. He'll be given a prescription for Phenergan. He also was given a prescription for Zithromax, he will fill this only if he starts to have a toothache.    Orpah Greek, MD 11/27/13 [phone]

## 2013-11-27 NOTE — ED Notes (Signed)
Pt's HR showing on the monitor as 38.  When radial checked manually, pulse was 40.  Pt reports vomiting earlier while at xray.  EDP notified.

## 2014-01-07 ENCOUNTER — Encounter (HOSPITAL_COMMUNITY): Payer: Self-pay | Admitting: Emergency Medicine

## 2014-01-07 ENCOUNTER — Emergency Department (HOSPITAL_COMMUNITY)
Admission: EM | Admit: 2014-01-07 | Discharge: 2014-01-07 | Disposition: A | Discharge status: Home / Self Care | Payer: Self-pay | Attending: Emergency Medicine | Admitting: Emergency Medicine

## 2014-01-07 DIAGNOSIS — B0229 Other postherpetic nervous system involvement: Secondary | ICD-10-CM

## 2014-01-07 DIAGNOSIS — Z792 Long term (current) use of antibiotics: Secondary | ICD-10-CM | POA: Insufficient documentation

## 2014-01-07 DIAGNOSIS — Z8709 Personal history of other diseases of the respiratory system: Secondary | ICD-10-CM | POA: Insufficient documentation

## 2014-01-07 DIAGNOSIS — B0230 Zoster ocular disease, unspecified: Secondary | ICD-10-CM | POA: Insufficient documentation

## 2014-01-07 DIAGNOSIS — Z8719 Personal history of other diseases of the digestive system: Secondary | ICD-10-CM | POA: Insufficient documentation

## 2014-01-07 DIAGNOSIS — Z791 Long term (current) use of non-steroidal anti-inflammatories (NSAID): Secondary | ICD-10-CM | POA: Insufficient documentation

## 2014-01-07 MED ORDER — TETRACAINE HCL 0.5 % OP SOLN
1.0000 [drp] | Freq: Once | OPHTHALMIC | Status: AC
  Administered 2014-01-07: 1 [drp] via OPHTHALMIC
  Filled 2014-01-07: qty 2

## 2014-01-07 MED ORDER — CYCLOPENTOLATE HCL 1 % OP SOLN: 1.0000 [drp] | Freq: Three times a day (TID) | OPHTHALMIC | Status: DC

## 2014-01-07 MED ORDER — FLUORESCEIN SODIUM 1 MG OP STRP
1.0000 | ORAL_STRIP | Freq: Once | OPHTHALMIC | Status: DC
  Filled 2014-01-07: qty 1

## 2014-01-07 MED ORDER — ACYCLOVIR 200 MG PO CAPS: 800.0000 mg | ORAL_CAPSULE | Freq: Every day | ORAL | Status: DC

## 2014-01-07 MED ORDER — OXYCODONE-ACETAMINOPHEN 5-325 MG PO TABS: 1.0000 | ORAL_TABLET | ORAL | Status: DC | PRN

## 2014-01-07 MED ORDER — TRIFLURIDINE 1 % OP SOLN: 1.0000 [drp] | OPHTHALMIC | Status: DC

## 2014-01-07 MED ORDER — OXYCODONE-ACETAMINOPHEN 5-325 MG PO TABS
1.0000 | ORAL_TABLET | Freq: Once | ORAL | Status: AC
  Administered 2014-01-07: 1 via ORAL
  Filled 2014-01-07: qty 1

## 2014-01-07 NOTE — ED Provider Notes (Signed)
CSN: [id number]     Arrival date & time 01/07/14  1338 History   First MD Initiated Contact with Patient 01/07/14 1522     Chief Complaint  Patient presents with  . Facial Swelling     (Consider location/radiation/quality/duration/timing/severity/associated sxs/prior Treatment) Patient is a 38 y.o. male presenting with eye pain. The history is provided by the patient.  Eye Pain This is a new problem. The current episode started yesterday. The problem occurs constantly. The problem has been gradually worsening. Associated symptoms include a rash. Nothing aggravates the symptoms. He has tried nothing for the symptoms.   Allen Ward is a 38 y.o. male who presents to the ED with facial swelling and rash that started 1 day ago. He complains of severe pain, photophobia, swelling of the left upper eyelid and blurry vision. The rash starts on the left forehead and extends across the left eye area.   Past Medical History  Diagnosis Date  . Hiatal hernia   . Pneumothorax    Past Surgical History  Procedure Laterality Date  . Chest tube insertion     Family History  Problem Relation Age of Onset  . Osteoporosis Mother   . Heart disease Mother   . Heart attack Father   . Heart disease Father   . Obesity Sister   . Hypertension Sister   . Diabetes Sister   . Cancer Sister    History  Substance Use Topics  . Smoking status: Never Smoker   . Smokeless tobacco: Never Used  . Alcohol Use: No    Review of Systems  Eyes: Positive for photophobia, pain, redness and visual disturbance.  Skin: Positive for rash.       Painful lesions      Allergies  Amoxicillin; Codeine; and Penicillins  Home Medications   Prior to Admission medications   Medication Sig Start Date End Date Taking? Authorizing Provider  azithromycin (ZITHROMAX) 250 MG tablet Take 1 tablet (250 mg total) by mouth daily. Take first 2 tablets together, then 1 every day until finished. 11/27/13   Orpah Greek, MD  clindamycin (CLEOCIN) 300 MG capsule Take 1 capsule (300 mg total) by mouth 4 (four) times daily. 11/21/13   Dorie Rank, MD  HYDROcodone-acetaminophen (NORCO/VICODIN) 5-325 MG per tablet Take 1-2 tablets by mouth every 4 (four) hours as needed. 11/21/13   Dorie Rank, MD  naproxen (NAPROSYN) 500 MG tablet Take 1 tablet (500 mg total) by mouth 2 (two) times daily. 11/21/13   Dorie Rank, MD  promethazine (PHENERGAN) 25 MG tablet Take 1 tablet (25 mg total) by mouth every 6 (six) hours as needed for nausea or vomiting. 11/27/13   Orpah Greek, MD   BP 137/73  Pulse 62  Temp(Src) 98.7 F (37.1 C) (Oral)  Resp 18  Ht 6' 1.5" (1.867 m)  Wt 130 lb (58.968 kg)  BMI 16.92 kg/m2  SpO2 100% Physical Exam  Nursing note and vitals reviewed. Constitutional: He is oriented to person, place, and time. He appears well-developed and well-nourished.  HENT:  Head:    Vesicular rash noted to left forehead that extends to the left orbit. Tender on exam.    Eyes: Conjunctivae and EOM are normal. Pupils are equal, round, and reactive to light. Left eye exhibits discharge. Left eye exhibits no hordeolum. No foreign body present in the left eye. Left eye exhibits normal extraocular motion.  Fundoscopic exam:      The left eye shows no hemorrhage.  Slit lamp  exam:      The left eye shows no corneal abrasion, no corneal ulcer, no foreign body, no hyphema and no fluorescein uptake.  Neck: Normal range of motion. Neck supple.  Cardiovascular: Normal rate.   Pulmonary/Chest: Effort normal.  Musculoskeletal: Normal range of motion.  Neurological: He is alert and oriented to person, place, and time. No cranial nerve deficit.  Skin: Skin is warm and dry. Rash noted.  Vesicular rash to face.    Psychiatric: He has a normal mood and affect. His behavior is normal.    ED Course  Procedures  Tetracaine opth. Drop to left eye, Visual acuity, slit lamp exam, eye stained, Consult with Dr. Iona Hansen  opthalmology and he recommended treatment with cyclogyl  Opth drops 1 drop to left eye TID and viroptic 1 drop to left eye 7 times per day. Follow up in the office 01/09/14.   MDM  38 y.o. male with herpes zoster to left facial area involving the left eye. Will treat as suggested by Dr. Iona Hansen and patient will follow up in the office. Discussed with the patient and all questioned fully answered. He will return here if symptoms worsen prior to his follow up with Dr. Iona Hansen.    Medication List         acyclovir 200 MG capsule  Commonly known as:  ZOVIRAX  Take 4 capsules (800 mg total) by mouth 5 (five) times daily.     cyclopentolate 1 % ophthalmic solution  Commonly known as:  CYCLOGYL  Place 1 drop into the left eye 3 (three) times daily.     oxyCODONE-acetaminophen 5-325 MG per tablet  Commonly known as:  ROXICET  Take 1 tablet by mouth every 4 (four) hours as needed for severe pain.     trifluridine 1 % ophthalmic solution  Commonly known as:  VIROPTIC  Place 1 drop into the left eye every 3 (three) hours.           [place], NP 01/07/14 1758

## 2014-01-07 NOTE — ED Provider Notes (Signed)
Medical screening examination/treatment/procedure(s) were conducted as a shared visit with non-physician practitioner(s) and myself.  I personally evaluated the patient during the encounter.   EKG Interpretation None     History and physical consistent with herpes simplex virus.  Discussed with ophthalmologist. Recommendations noted in nurse practitioner chart.  Nat Christen, MD 01/07/14 [phone]

## 2014-01-07 NOTE — ED Notes (Signed)
Patient with no complaints at this time. Respirations even and unlabored. Skin warm/dry. Discharge instructions reviewed with patient at this time. Patient given opportunity to voice concerns/ask questions. Patient discharged at this time and left Emergency Department with steady gait.   

## 2014-01-07 NOTE — ED Notes (Signed)
Pt with left eye swelling and rash above left eye since Friday morning

## 2014-07-29 ENCOUNTER — Emergency Department (HOSPITAL_COMMUNITY): Payer: Self-pay

## 2014-07-29 ENCOUNTER — Emergency Department (HOSPITAL_COMMUNITY)
Admission: EM | Admit: 2014-07-29 | Discharge: 2014-07-29 | Disposition: A | Discharge status: Home / Self Care | Payer: Self-pay | Attending: Emergency Medicine | Admitting: Emergency Medicine

## 2014-07-29 ENCOUNTER — Encounter (HOSPITAL_COMMUNITY): Payer: Self-pay

## 2014-07-29 DIAGNOSIS — M549 Dorsalgia, unspecified: Secondary | ICD-10-CM | POA: Insufficient documentation

## 2014-07-29 DIAGNOSIS — Z8709 Personal history of other diseases of the respiratory system: Secondary | ICD-10-CM | POA: Insufficient documentation

## 2014-07-29 DIAGNOSIS — R1013 Epigastric pain: Secondary | ICD-10-CM | POA: Insufficient documentation

## 2014-07-29 DIAGNOSIS — Z79899 Other long term (current) drug therapy: Secondary | ICD-10-CM | POA: Insufficient documentation

## 2014-07-29 DIAGNOSIS — R0602 Shortness of breath: Secondary | ICD-10-CM | POA: Insufficient documentation

## 2014-07-29 DIAGNOSIS — B349 Viral infection, unspecified: Secondary | ICD-10-CM | POA: Insufficient documentation

## 2014-07-29 DIAGNOSIS — Z8719 Personal history of other diseases of the digestive system: Secondary | ICD-10-CM | POA: Insufficient documentation

## 2014-07-29 DIAGNOSIS — R0789 Other chest pain: Secondary | ICD-10-CM | POA: Insufficient documentation

## 2014-07-29 DIAGNOSIS — Z880 Allergy status to penicillin: Secondary | ICD-10-CM | POA: Insufficient documentation

## 2014-07-29 LAB — CBC WITH DIFFERENTIAL/PLATELET
Basophils Absolute: 0 10*3/uL (ref 0.0–0.1)
Basophils Relative: 1 % (ref 0–1)
EOS ABS: 0 10*3/uL (ref 0.0–0.7)
Eosinophils Relative: 0 % (ref 0–5)
HCT: 41.2 % (ref 39.0–52.0)
Hemoglobin: 14.5 g/dL (ref 13.0–17.0)
LYMPHS PCT: 11 % — AB (ref 12–46)
Lymphs Abs: 0.3 10*3/uL — ABNORMAL LOW (ref 0.7–4.0)
MCH: 32.1 pg (ref 26.0–34.0)
MCHC: 35.2 g/dL (ref 30.0–36.0)
MCV: 91.2 fL (ref 78.0–100.0)
MONOS PCT: 5 % (ref 3–12)
Monocytes Absolute: 0.2 10*3/uL (ref 0.1–1.0)
Neutro Abs: 2.5 10*3/uL (ref 1.7–7.7)
Neutrophils Relative %: 84 % — ABNORMAL HIGH (ref 43–77)
PLATELETS: 89 10*3/uL — AB (ref 150–400)
RBC: 4.52 MIL/uL (ref 4.22–5.81)
RDW: 12.6 % (ref 11.5–15.5)
WBC: 2.9 10*3/uL — AB (ref 4.0–10.5)

## 2014-07-29 LAB — COMPREHENSIVE METABOLIC PANEL
ALBUMIN: 4.3 g/dL (ref 3.5–5.0)
ALT: 24 U/L (ref 17–63)
AST: 33 U/L (ref 15–41)
Alkaline Phosphatase: 56 U/L (ref 38–126)
Anion gap: 9 (ref 5–15)
BUN: 17 mg/dL (ref 6–20)
CALCIUM: 9 mg/dL (ref 8.9–10.3)
CO2: 28 mmol/L (ref 22–32)
CREATININE: 1.1 mg/dL (ref 0.61–1.24)
Chloride: 99 mmol/L — ABNORMAL LOW (ref 101–111)
GFR calc Af Amer: >60 mL/min (ref >60)
Glucose, Bld: 104 mg/dL — ABNORMAL HIGH (ref 65–99)
Potassium: 4.7 mmol/L (ref 3.5–5.1)
Sodium: 136 mmol/L (ref 135–145)
Total Bilirubin: 1.6 mg/dL — ABNORMAL HIGH (ref 0.3–1.2)
Total Protein: 6.8 g/dL (ref 6.5–8.1)

## 2014-07-29 LAB — D-DIMER, QUANTITATIVE (NOT AT ARMC): D DIMER QUANT: 1.79 ug{FEU}/mL — AB (ref 0.00–0.48)

## 2014-07-29 LAB — LIPASE, BLOOD: Lipase: 21 U/L — ABNORMAL LOW (ref 22–51)

## 2014-07-29 LAB — TROPONIN I

## 2014-07-29 MED ORDER — ONDANSETRON HCL 4 MG/2ML IJ SOLN
4.0000 mg | Freq: Once | INTRAMUSCULAR | Status: DC
  Filled 2014-07-29: qty 2

## 2014-07-29 MED ORDER — ONDANSETRON HCL 4 MG/2ML IJ SOLN
4.0000 mg | Freq: Once | INTRAMUSCULAR | Status: AC
  Administered 2014-07-29: 4 mg via INTRAVENOUS

## 2014-07-29 MED ORDER — SODIUM CHLORIDE 0.9 % IV BOLUS (SEPSIS)
1000.0000 mL | Freq: Once | INTRAVENOUS | Status: AC
  Administered 2014-07-29: 1000 mL via INTRAVENOUS

## 2014-07-29 MED ORDER — ONDANSETRON 4 MG PO TBDP: 4.0000 mg | ORAL_TABLET | Freq: Three times a day (TID) | ORAL | Status: DC | PRN

## 2014-07-29 MED ORDER — MORPHINE SULFATE 4 MG/ML IJ SOLN
4.0000 mg | Freq: Once | INTRAMUSCULAR | Status: AC
  Administered 2014-07-29: 4 mg via INTRAVENOUS
  Filled 2014-07-29: qty 1

## 2014-07-29 MED ORDER — IOHEXOL 350 MG/ML SOLN
100.0000 mL | Freq: Once | INTRAVENOUS | Status: AC | PRN
  Administered 2014-07-29: 100 mL via INTRAVENOUS

## 2014-07-29 MED ORDER — IBUPROFEN 600 MG PO TABS: 600.0000 mg | ORAL_TABLET | Freq: Four times a day (QID) | ORAL | Status: DC | PRN

## 2014-07-29 NOTE — ED Provider Notes (Signed)
CSN: [id number]     Arrival date & time 07/29/14  0003 History  This chart was scribed for Merryl Hacker, MD by Marlowe Kays, ED Scribe. This patient was seen in room [facility] and the patient's care was started at 12:24 AM.  Chief Complaint  Patient presents with  . Chest Pain   The history is provided by the patient and medical records. No language interpreter was used.    HPI Comments:  Allen Ward is a 39 y.o. male who presents to the Emergency Department complaining of sharp pain across his lower back and chest that started four days ago. He reports associated SOB, nausea and two episodes of emesis. He reports a fever Tmax of 103 degrees. Pt rates his pain at 8/10. He states he has taken Tylenol Cold and Sinus with no significant relief of the symptoms. Breathing makes his pain worse. Denies alleviating factors. He states he has not felt like this since he had his pneumothorax of the right lung. Denies abdominal pain. PMHx of hiatal hernia and pneumothorax. He states he has not smoked in two days because he does not feel well. Denies alcohol use.   Past Medical History  Diagnosis Date  . Hiatal hernia   . Pneumothorax    Past Surgical History  Procedure Laterality Date  . Chest tube insertion     Family History  Problem Relation Age of Onset  . Osteoporosis Mother   . Heart disease Mother   . Heart attack Father   . Heart disease Father   . Obesity Sister   . Hypertension Sister   . Diabetes Sister   . Cancer Sister    History  Substance Use Topics  . Smoking status: Never Smoker   . Smokeless tobacco: Never Used  . Alcohol Use: No    Review of Systems  Constitutional: Positive for fever.  Respiratory: Positive for chest tightness and shortness of breath. Negative for cough.   Cardiovascular: Positive for chest pain. Negative for palpitations.  Gastrointestinal: Positive for nausea and vomiting. Negative for abdominal pain.  Genitourinary: Negative.   Negative for dysuria.  Musculoskeletal: Positive for back pain.  Skin: Negative for rash.  Neurological: Negative for headaches.  All other systems reviewed and are negative.   Allergies  Amoxicillin; Penicillins; and Codeine  Home Medications   Prior to Admission medications   Medication Sig Start Date End Date Taking? Authorizing Provider  esomeprazole (NEXIUM) 20 MG capsule Take 20 mg by mouth daily at 12 noon.   Yes Historical Provider, MD  acyclovir (ZOVIRAX) 200 MG capsule Take 4 capsules (800 mg total) by mouth 5 (five) times daily. 01/07/14   Hope Bunnie Pion, NP  cyclopentolate (CYCLOGYL) 1 % ophthalmic solution Place 1 drop into the left eye 3 (three) times daily. 01/07/14   Hope Bunnie Pion, NP  ibuprofen (ADVIL,MOTRIN) 600 MG tablet Take 1 tablet (600 mg total) by mouth every 6 (six) hours as needed. 07/29/14   Merryl Hacker, MD  ondansetron (ZOFRAN ODT) 4 MG disintegrating tablet Take 1 tablet (4 mg total) by mouth every 8 (eight) hours as needed for nausea or vomiting. 07/29/14   Merryl Hacker, MD  oxyCODONE-acetaminophen (ROXICET) 5-325 MG per tablet Take 1 tablet by mouth every 4 (four) hours as needed for severe pain. 01/07/14   Hope Bunnie Pion, NP  trifluridine (VIROPTIC) 1 % ophthalmic solution Place 1 drop into the left eye every 3 (three) hours. 01/07/14   Hope Bunnie Pion, NP  Triage Vitals: BP 123/79 mmHg  Pulse 57  Temp(Src) 99.2 F (37.3 C) (Oral)  Resp 20  Ht 6\' 1"  (1.854 m)  Wt 125 lb (56.7 kg)  BMI 16.50 kg/m2  SpO2 98% Physical Exam  Constitutional: He is oriented to person, place, and time. He appears well-developed and well-nourished.  Thin, anorexic  HENT:  Head: Normocephalic and atraumatic.  Eyes: Pupils are equal, round, and reactive to light.  Cardiovascular: Normal rate, regular rhythm and normal heart sounds.   No murmur heard. Pulmonary/Chest: Effort normal and breath sounds normal. No respiratory distress. He has no wheezes. He exhibits no  tenderness.  Abdominal: Soft. Bowel sounds are normal. He exhibits no distension. There is tenderness. There is no rebound and no guarding.  Tenderness to palpation over the epigastrium without rebound or guarding  Musculoskeletal: He exhibits no edema.  Neurological: He is alert and oriented to person, place, and time.  Skin: Skin is warm and dry.  Psychiatric: He has a normal mood and affect.  Nursing note and vitals reviewed.   ED Course  Procedures (including critical care time) DIAGNOSTIC STUDIES: Oxygen Saturation is 98% on RA, normal by my interpretation.   COORDINATION OF CARE: 12:30 AM- Will order CXR, labs and pain medication. Pt verbalizes understanding and agrees to plan.  Medications  sodium chloride 0.9 % bolus 1,000 mL (0 mLs Intravenous Stopped 07/29/14 0459)  morphine 4 MG/ML injection 4 mg (4 mg Intravenous Given 07/29/14 0058)  ondansetron (ZOFRAN) injection 4 mg (4 mg Intravenous Given 07/29/14 0100)  iohexol (OMNIPAQUE) 350 MG/ML injection 100 mL (100 mLs Intravenous Contrast Given 07/29/14 0448)    Labs Review Labs Reviewed  CBC WITH DIFFERENTIAL/PLATELET - Abnormal; Notable for the following:    WBC 2.9 (*)    Platelets 89 (*)    Neutrophils Relative % 84 (*)    Lymphocytes Relative 11 (*)    Lymphs Abs 0.3 (*)    All other components within normal limits  COMPREHENSIVE METABOLIC PANEL - Abnormal; Notable for the following:    Chloride 99 (*)    Glucose, Bld 104 (*)    Total Bilirubin 1.6 (*)    All other components within normal limits  LIPASE, BLOOD - Abnormal; Notable for the following:    Lipase 21 (*)    All other components within normal limits  D-DIMER, QUANTITATIVE - Abnormal; Notable for the following:    D-Dimer, Quant 1.79 (*)    All other components within normal limits  TROPONIN I    Imaging Review Ct Angio Chest Pe W/cm &/or Wo Cm  07/29/2014   CLINICAL DATA:  Sharp pain across the low back and chest starting 4 days ago. Shortness of  breath, nausea, and vomiting.  EXAM: CT ANGIOGRAPHY CHEST WITH CONTRAST  TECHNIQUE: Multidetector CT imaging of the chest was performed using the standard protocol during bolus administration of intravenous contrast. Multiplanar CT image reconstructions and MIPs were obtained to evaluate the vascular anatomy.  CONTRAST:  134mL OMNIPAQUE IOHEXOL 350 MG/ML SOLN  COMPARISON:  Chest 07/29/2014  FINDINGS: Normal heart size. Normal caliber thoracic aorta. No evidence of aortic aneurysm or dissection. Great vessel origins are patent. Central pulmonary arteries are well opacified. No focal filling defects. No evidence of significant pulmonary embolus. Esophagus is decompressed. No significant lymphadenopathy in the chest.  Lungs are clear. No focal airspace disease or consolidation. No interstitial changes. No pleural effusions. No pneumothorax.  Included portions of the upper abdominal organs are grossly unremarkable. No destructive  bone lesions.  Review of the MIP images confirms the above findings.  IMPRESSION: No evidence of significant pulmonary embolus. No evidence of active pulmonary disease.   Electronically Signed   By: Lucienne Capers M.D.   On: 07/29/2014 05:27   Dg Chest Portable 1 View  07/29/2014   CLINICAL DATA:  Acute onset of chest pain, mild shortness of breath, nausea and vomiting. Initial encounter.  EXAM: PORTABLE CHEST - 1 VIEW  COMPARISON:  Chest radiograph performed 11/27/2013  FINDINGS: The lungs are well-aerated and clear. There is no evidence of focal opacification, pleural effusion or pneumothorax.  The cardiomediastinal silhouette is within normal limits. No acute osseous abnormalities are seen.  IMPRESSION: No acute cardiopulmonary process seen.   Electronically Signed   By: Garald Balding M.D.   On: 07/29/2014 01:06     EKG Interpretation   Date/Time:  Saturday Jul 29 2014 00:17:44 EDT Ventricular Rate:  51 PR Interval:  104 QRS Duration: 109 QT Interval:  431 QTC Calculation:  397 R Axis:   68 Text Interpretation:  Sinus rhythm Short PR interval ST elev, probable  normal early repol pattern Confirmed by HORTON  MD, COURTNEY ([id number]) on  07/29/2014 12:23:16 AM      MDM   Final diagnoses:  Other chest pain  Viral syndrome    [place] presents with chest pain, back pain, nausea and fever.  Nontoxic on exam.  Afebrile and exam largely unremarkable.  Patient given fluids, pain and nausea meds.  Low suspicion for ACS.  EKG, CXR and troponin reassuring.  GIven that pain is not reproducible and pleuritic and no wheezing on exam, d-dimer was sent and was elevated.  CT neg for PE.  Basic labwork reassuring.  Given reports of fever, n/v in addition to cp, patient may have a viral syndrome.  Reports improvement with symptomatic treatment.  Resting comfortably.  After history, exam, and medical workup I feel the patient has been appropriately medically screened and is safe for discharge home. Pertinent diagnoses were discussed with the patient. Patient was given return precautions.   I personally performed the services described in this documentation, which was scribed in my presence. The recorded information has been reviewed and is accurate.    Merryl Hacker, MD 07/29/14 [phone]

## 2014-07-29 NOTE — ED Notes (Signed)
Pt reports pain across lower back , lower chest, nausea, vomiting, diarrhea for one week.

## 2014-07-29 NOTE — Discharge Instructions (Signed)
Chest Pain (Nonspecific) °It is often hard to give a specific diagnosis for the cause of chest pain. There is always a chance that your pain could be related to something serious, such as a heart attack or a blood clot in the lungs. You need to follow up with your health care provider for further evaluation. °CAUSES  °· Heartburn. °· Pneumonia or bronchitis. °· Anxiety or stress. °· Inflammation around your heart (pericarditis) or lung (pleuritis or pleurisy). °· A blood clot in the lung. °· A collapsed lung (pneumothorax). It can develop suddenly on its own (spontaneous pneumothorax) or from trauma to the chest. °· Shingles infection (herpes zoster virus). °The chest wall is composed of bones, muscles, and cartilage. Any of these can be the source of the pain. °· The bones can be bruised by injury. °· The muscles or cartilage can be strained by coughing or overwork. °· The cartilage can be affected by inflammation and become sore (costochondritis). °DIAGNOSIS  °Lab tests or other studies may be needed to find the cause of your pain. Your health care provider may have you take a test called an ambulatory electrocardiogram (ECG). An ECG records your heartbeat patterns over a 24-hour period. You may also have other tests, such as: °· Transthoracic echocardiogram (TTE). During echocardiography, sound waves are used to evaluate how blood flows through your heart. °· Transesophageal echocardiogram (TEE). °· Cardiac monitoring. This allows your health care provider to monitor your heart rate and rhythm in real time. °· Holter monitor. This is a portable device that records your heartbeat and can help diagnose heart arrhythmias. It allows your health care provider to track your heart activity for several days, if needed. °· Stress tests by exercise or by giving medicine that makes the heart beat faster. °TREATMENT  °· Treatment depends on what may be causing your chest pain. Treatment may include: °· Acid blockers for  heartburn. °· Anti-inflammatory medicine. °· Pain medicine for inflammatory conditions. °· Antibiotics if an infection is present. °· You may be advised to change lifestyle habits. This includes stopping smoking and avoiding alcohol, caffeine, and chocolate. °· You may be advised to keep your head raised (elevated) when sleeping. This reduces the chance of acid going backward from your stomach into your esophagus. °Most of the time, nonspecific chest pain will improve within 2-3 days with rest and mild pain medicine.  °HOME CARE INSTRUCTIONS  °· If antibiotics were prescribed, take them as directed. Finish them even if you start to feel better. °· For the next few days, avoid physical activities that bring on chest pain. Continue physical activities as directed. °· Do not use any tobacco products, including cigarettes, chewing tobacco, or electronic cigarettes. °· Avoid drinking alcohol. °· Only take medicine as directed by your health care provider. °· Follow your health care provider's suggestions for further testing if your chest pain does not go away. °· Keep any follow-up appointments you made. If you do not go to an appointment, you could develop lasting (chronic) problems with pain. If there is any problem keeping an appointment, call to reschedule. °SEEK MEDICAL CARE IF:  °· Your chest pain does not go away, even after treatment. °· You have a rash with blisters on your chest. °· You have a fever. °SEEK IMMEDIATE MEDICAL CARE IF:  °· You have increased chest pain or pain that spreads to your arm, neck, jaw, back, or abdomen. °· You have shortness of breath. °· You have an increasing cough, or you cough   up blood.  You have severe back or abdominal pain.  You feel nauseous or vomit.  You have severe weakness.  You faint.  You have chills. This is an emergency. Do not wait to see if the pain will go away. Get medical help at once. Call your local emergency services (911 in [place] Do not drive  yourself to the hospital. MAKE SURE YOU:   Understand these instructions.  Will watch your condition.  Will get help right away if you are not doing well or get worse. Document Released: 12/11/2004 Document Revised: 03/08/2013 Document Reviewed: 10/07/2007 [facility] Patient Information 2015 [place], [place]. This information is not intended to replace advice given to you by your health care provider. Make sure you discuss any questions you have with your health care provider. Viral Infections A viral infection can be caused by different types of viruses.Most viral infections are not serious and resolve on their own. However, some infections may cause severe symptoms and may lead to further complications. SYMPTOMS Viruses can frequently cause:  Minor sore throat.  Aches and pains.  Headaches.  Runny nose.  Different types of rashes.  Watery eyes.  Tiredness.  Cough.  Loss of appetite.  Gastrointestinal infections, resulting in nausea, vomiting, and diarrhea. These symptoms do not respond to antibiotics because the infection is not caused by bacteria. However, you might catch a bacterial infection following the viral infection. This is sometimes called a "superinfection." Symptoms of such a bacterial infection may include:  Worsening sore throat with pus and difficulty swallowing.  Swollen neck glands.  Chills and a high or persistent fever.  Severe headache.  Tenderness over the sinuses.  Persistent overall ill feeling (malaise), muscle aches, and tiredness (fatigue).  Persistent cough.  Yellow, green, or brown mucus production with coughing. HOME CARE INSTRUCTIONS   Only take over-the-counter or prescription medicines for pain, discomfort, diarrhea, or fever as directed by your caregiver.  Drink enough water and fluids to keep your urine clear or pale yellow. Sports drinks can provide valuable electrolytes, sugars, and hydration.  Get plenty of rest and  maintain proper nutrition. Soups and broths with crackers or rice are fine. SEEK IMMEDIATE MEDICAL CARE IF:   You have severe headaches, shortness of breath, chest pain, neck pain, or an unusual rash.  You have uncontrolled vomiting, diarrhea, or you are unable to keep down fluids.  You or your child has an oral temperature above 102 F (38.9 C), not controlled by medicine.  Your baby is older than 3 months with a rectal temperature of 102 F (38.9 C) or higher.  Your baby is 20 months old or younger with a rectal temperature of 100.4 F (38 C) or higher. MAKE SURE YOU:   Understand these instructions.  Will watch your condition.  Will get help right away if you are not doing well or get worse. Document Released: 12/11/2004 Document Revised: 05/26/2011 Document Reviewed: 07/08/2010 [facility] Patient Information 2015 [place], [place]. This information is not intended to replace advice given to you by your health care provider. Make sure you discuss any questions you have with your health care provider.

## 2014-12-07 IMAGING — CR DG CHEST 2V
2 series · 2 of 2 positions shown · non-contrast
Comparison: March 27, 2013

CLINICAL DATA: Recent pneumothorax; chest pain

EXAM:
CHEST  2 VIEW

[view not recorded (1 of 2)]
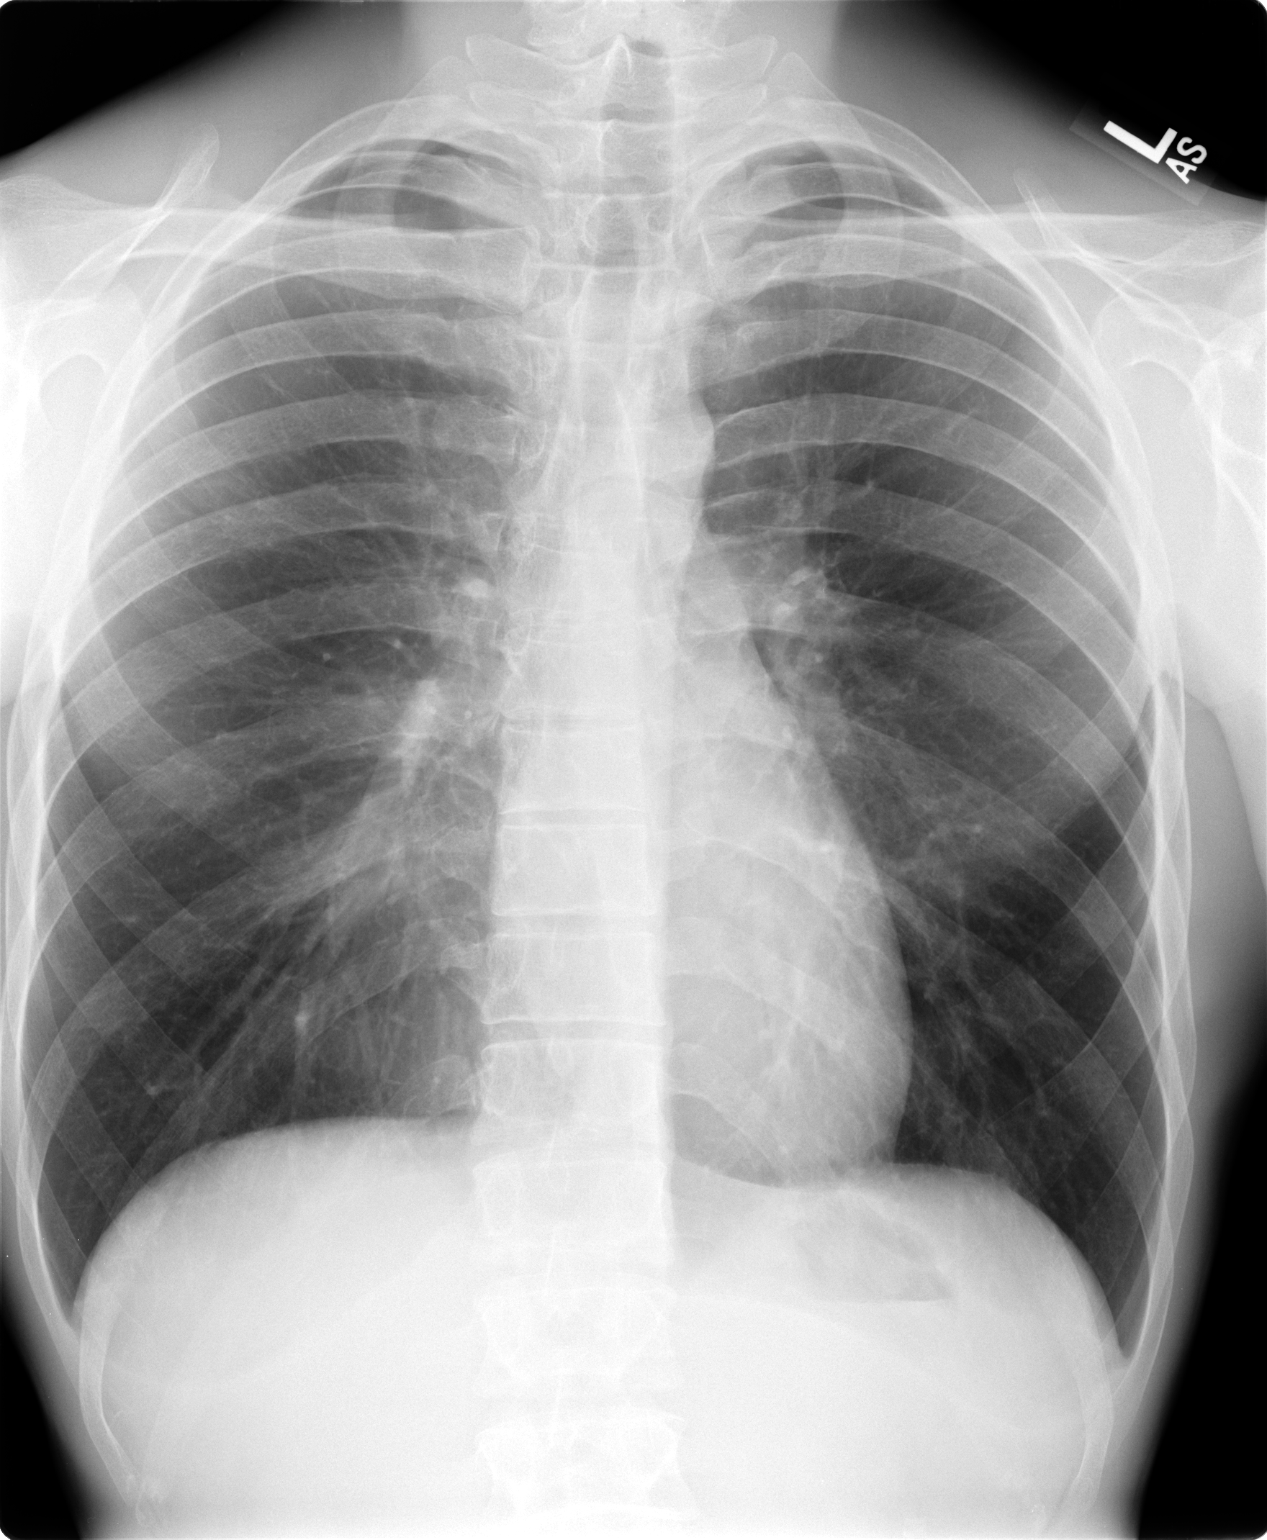

[view not recorded (2 of 2)]
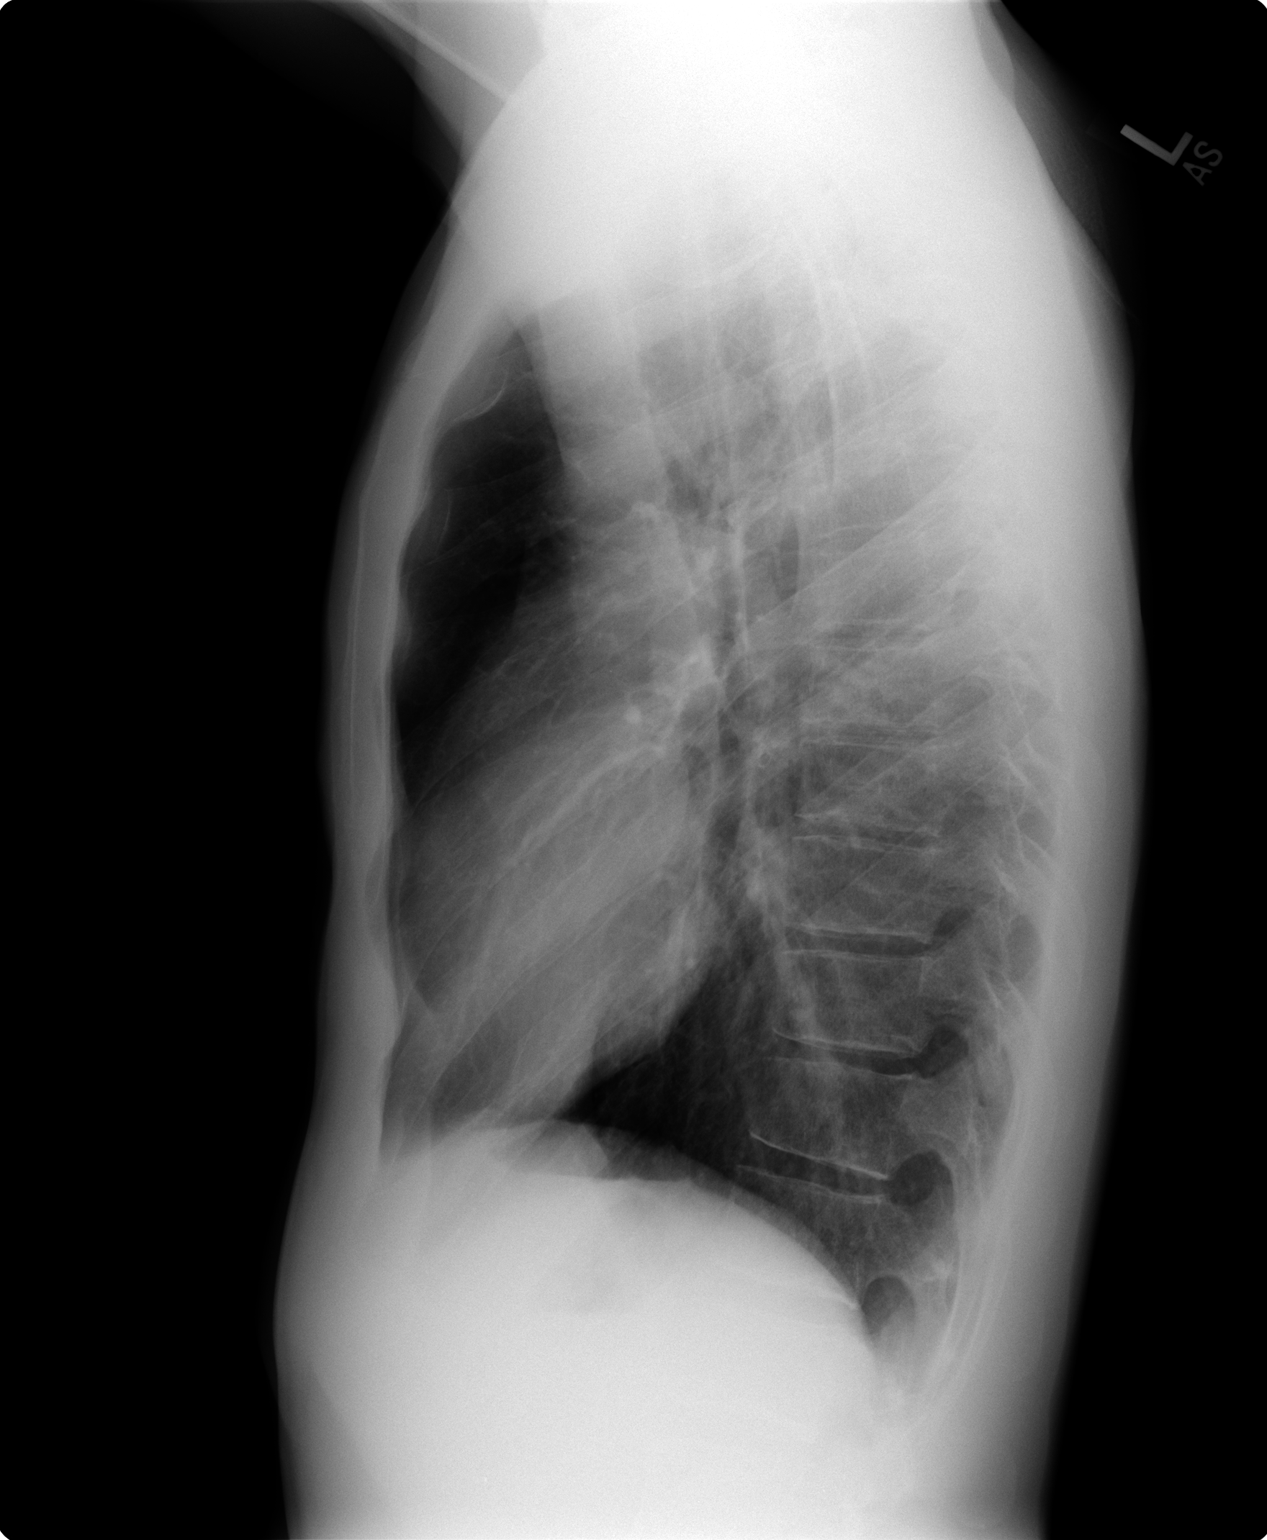

[2 of 2 positions shown; findings below may reference images not displayed]

FINDINGS: The previously noted apical pneumothorax is smaller with only rather
minimal apical pneumothorax remaining currently. Lungs elsewhere are
clear. Heart size and pulmonary vascularity are normal. No
adenopathy. No bone lesions.
IMPRESSION: Right apical pneumothorax is smaller compared to recent prior study.
There is a rather minimal residual right apical pneumothorax. Lungs
elsewhere are clear.

## 2019-10-11 ENCOUNTER — Emergency Department (HOSPITAL_COMMUNITY): Payer: Self-pay

## 2019-10-11 ENCOUNTER — Other Ambulatory Visit: Payer: Self-pay

## 2019-10-11 ENCOUNTER — Encounter (HOSPITAL_COMMUNITY): Payer: Self-pay | Admitting: Emergency Medicine

## 2019-10-11 ENCOUNTER — Emergency Department (HOSPITAL_COMMUNITY)
Admission: EM | Admit: 2019-10-11 | Discharge: 2019-10-11 | Disposition: A | Discharge status: Home / Self Care | Payer: Self-pay | Attending: Emergency Medicine | Admitting: Emergency Medicine

## 2019-10-11 DIAGNOSIS — R112 Nausea with vomiting, unspecified: Secondary | ICD-10-CM | POA: Insufficient documentation

## 2019-10-11 DIAGNOSIS — Z20822 Contact with and (suspected) exposure to covid-19: Secondary | ICD-10-CM | POA: Insufficient documentation

## 2019-10-11 DIAGNOSIS — R109 Unspecified abdominal pain: Secondary | ICD-10-CM | POA: Insufficient documentation

## 2019-10-11 DIAGNOSIS — R110 Nausea: Secondary | ICD-10-CM

## 2019-10-11 DIAGNOSIS — R0789 Other chest pain: Secondary | ICD-10-CM | POA: Insufficient documentation

## 2019-10-11 LAB — CBC
HCT: 46.4 % (ref 39.0–52.0)
Hemoglobin: 15.9 g/dL (ref 13.0–17.0)
MCH: 32.4 pg (ref 26.0–34.0)
MCHC: 34.3 g/dL (ref 30.0–36.0)
MCV: 94.7 fL (ref 80.0–100.0)
Platelets: 198 10*3/uL (ref 150–400)
RBC: 4.9 MIL/uL (ref 4.22–5.81)
RDW: 12.2 % (ref 11.5–15.5)
WBC: 5.1 10*3/uL (ref 4.0–10.5)
nRBC: 0 % (ref 0.0–0.2)

## 2019-10-11 LAB — COMPREHENSIVE METABOLIC PANEL
ALT: 14 U/L (ref 0–44)
AST: 13 U/L — ABNORMAL LOW (ref 15–41)
Albumin: 4.9 g/dL (ref 3.5–5.0)
Alkaline Phosphatase: 58 U/L (ref 38–126)
Anion gap: 10 (ref 5–15)
BUN: 18 mg/dL (ref 6–20)
CO2: 28 mmol/L (ref 22–32)
Calcium: 9.3 mg/dL (ref 8.9–10.3)
Chloride: 98 mmol/L (ref 98–111)
Creatinine, Ser: 1.12 mg/dL (ref 0.61–1.24)
GFR calc Af Amer: >60 mL/min (ref >60)
GFR calc non Af Amer: >60 mL/min (ref >60)
Glucose, Bld: 93 mg/dL (ref 70–99)
Potassium: 4.4 mmol/L (ref 3.5–5.1)
Sodium: 136 mmol/L (ref 135–145)
Total Bilirubin: 1.7 mg/dL — ABNORMAL HIGH (ref 0.3–1.2)
Total Protein: 7.6 g/dL (ref 6.5–8.1)

## 2019-10-11 LAB — LIPASE, BLOOD: Lipase: 30 U/L (ref 11–51)

## 2019-10-11 LAB — TROPONIN I (HIGH SENSITIVITY)
Troponin I (High Sensitivity): 2 ng/L (ref <18)
Troponin I (High Sensitivity): 2 ng/L (ref <18)

## 2019-10-11 LAB — SARS CORONAVIRUS 2 (TAT 6-24 HRS): SARS Coronavirus 2: NEGATIVE

## 2019-10-11 MED ORDER — SODIUM CHLORIDE 0.9 % IV BOLUS: 500.0000 mL | Freq: Once | INTRAVENOUS | Status: DC

## 2019-10-11 MED ORDER — IOHEXOL 300 MG/ML  SOLN
100.0000 mL | Freq: Once | INTRAMUSCULAR | Status: AC | PRN
  Administered 2019-10-11: 100 mL via INTRAVENOUS

## 2019-10-11 MED ORDER — FENTANYL CITRATE (PF) 100 MCG/2ML IJ SOLN
40.0000 ug | Freq: Once | INTRAMUSCULAR | Status: AC
  Administered 2019-10-11: 40 ug via INTRAVENOUS
  Filled 2019-10-11: qty 2

## 2019-10-11 MED ORDER — SODIUM CHLORIDE 0.9% FLUSH: 3.0000 mL | Freq: Once | INTRAVENOUS | Status: DC

## 2019-10-11 MED ORDER — SODIUM CHLORIDE 0.9 % IV BOLUS
1000.0000 mL | Freq: Once | INTRAVENOUS | Status: AC
  Administered 2019-10-11: 1000 mL via INTRAVENOUS

## 2019-10-11 NOTE — Discharge Instructions (Addendum)
You were evaluated in the emergency department due to chest pain, shortness of breath, nausea.  In the emergency department we worked you up including checking cardiac enzymes to look for signs of a heart attack, doing a chest x-ray, and doing a CT scan of your abdomen and pelvis.  During all this work-up we did not find any obvious cause of your symptoms.  Because of this it is likely that your symptoms are caused by a GI bug and will likely improve over the next few days.  It is important to stay well-hydrated.  I would like for you to follow-up with your primary care doctor in the next 1 week to ensure that your symptoms have resolved.  Please return to the emergency department if you develop more chest pain, inability to tolerate fluids by mouth, worsening stomach pain, or other concerning symptoms.

## 2019-10-11 NOTE — ED Notes (Signed)
Pt. With chest xray

## 2019-10-11 NOTE — ED Triage Notes (Signed)
Pt c/o of n/v/d and chest pain since Saturday. Pt states" I was working outside, got hot and felt weak, felt bad ever since"

## 2019-10-11 NOTE — ED Provider Notes (Signed)
[facility] EMERGENCY DEPARTMENT Provider Note   CSN: [id number] Arrival date & time: 10/11/19  1962     History Chief Complaint  Patient presents with  . Nausea  . Chest Pain    Allen Ward is a 43 y.o. male.  Patient is a 45 year old male with no past medical history significant for hiatal hernia and a pneumothorax approximately 7 years ago presenting with a few days of nausea, vomiting, chest pain, shortness of breath.  Patient states he was working on a metal roof on Saturday and got "overheated" and began throwing up and feeling generalized weakness.  Patient states that he went home and rested and on Sunday in the morning he noted he had chest pain and shortness of breath while resting.  He states he does not normally have this.  He states his chest pain is worse on the left side and feels like his left side is "bundled up".  It occurs at both rest and with activity but is not worse with palpation.  He does not know of any cardiac history.  He states that he threw up approximately 5 times on Saturday and approximately 3 times on Sunday but has been unable to eat since then because he feels nauseated every time he tries.  He states he also had a few bouts of diarrhea on Sunday that self resolved.  Patient also complains of some fuzzy vision which started on Saturday and seems to come and go.  He states he always has some abdominal pain due to a hiatal hernia which she has had since around 44 years of age and has some burning with eating which he has been told is because of this.  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  He states that he thinks he has lost approximately 45 pounds in the last 1.5 months.  Denies smoking but states he does dip and use marijuana. Denies history of cancer, states he does have a family history of cancer with his brother having throat  cancer in his mother "cancer of her bones" and "some other cancer".  Patient states that he tries hard to gain weight but has been unable to do so, states that since around 18 he has smoked marijuana for the purpose of trying to gain weight and increase his appetite.  Denies sick contacts or recent travel outside of [place].  States he has not had the COVID-19 vaccine.        Past Medical History:  Diagnosis Date  . Hiatal hernia   . Pneumothorax     Patient Active Problem List   Diagnosis Date Noted  . Pneumothorax 03/21/2013    Past Surgical History:  Procedure Laterality Date  . CHEST TUBE INSERTION         Family History  Problem Relation Age of Onset  . Osteoporosis Mother   . Heart disease Mother   . Heart attack Father   . Heart disease Father   . Obesity Sister   . Hypertension Sister   . Diabetes Sister   . Cancer Sister     Social History   Tobacco Use  . Smoking status: Never Smoker  . Smokeless tobacco: Never Used  Substance Use Topics  . Alcohol use: No  . Drug use: Yes    Types: Marijuana    Comment: last  used this morning 01/07/14    Home Medications Prior to Admission medications   Medication Sig Start Date End Date Taking? Authorizing Provider  acyclovir (ZOVIRAX) 200 MG capsule Take 4 capsules (800 mg total) by mouth 5 (five) times daily. 01/07/14   Ashley Murrain, NP  cyclopentolate (CYCLOGYL) 1 % ophthalmic solution Place 1 drop into the left eye 3 (three) times daily. 01/07/14   Ashley Murrain, NP  esomeprazole (NEXIUM) 20 MG capsule Take 20 mg by mouth daily at 12 noon.    [provider]  ibuprofen (ADVIL,MOTRIN) 600 MG tablet Take 1 tablet (600 mg total) by mouth every 6 (six) hours as needed. 07/29/14   Horton, Barbette Hair, MD  ondansetron (ZOFRAN ODT) 4 MG disintegrating tablet Take 1 tablet (4 mg total) by mouth every 8 (eight) hours as needed for nausea or vomiting. 07/29/14   Horton, Barbette Hair, MD    oxyCODONE-acetaminophen (ROXICET) 5-325 MG per tablet Take 1 tablet by mouth every 4 (four) hours as needed for severe pain. 01/07/14   Ashley Murrain, NP  trifluridine (VIROPTIC) 1 % ophthalmic solution Place 1 drop into the left eye every 3 (three) hours. 01/07/14   Ashley Murrain, NP    Allergies    Amoxicillin, Penicillins, and Codeine  Review of Systems   Review of Systems  Constitutional: Positive for unexpected weight change. Negative for chills and fever.  HENT: Negative for congestion, sinus pressure and sore throat.   Eyes: Positive for visual disturbance.  Respiratory: Positive for chest tightness and shortness of breath.   Cardiovascular: Positive for chest pain.  Gastrointestinal: Positive for abdominal pain and diarrhea. Negative for blood in stool and nausea.  Genitourinary: Negative for dysuria.  Neurological: Positive for weakness (generalized). Negative for headaches.    Physical Exam Updated Vital Signs BP (!) 134/82   Pulse 59   Temp 98.2 F (36.8 C) (Oral)   Resp 22   Ht 6\' 1"  (1.854 m)   Wt 54.4 kg   SpO2 100%   BMI 15.83 kg/m   Physical Exam Constitutional:      Comments: Cachectic/chronically ill appearing individual in no current distress  HENT:     Head: Normocephalic and atraumatic.  Eyes:     Extraocular Movements: Extraocular movements intact.     Pupils: Pupils are equal, round, and reactive to light.  Neck:     Comments: 2cm cystic mass on right neck Cardiovascular:     Rate and Rhythm: Normal rate and regular rhythm.     Heart sounds: No murmur heard.   Pulmonary:     Effort: Pulmonary effort is normal. No respiratory distress.     Breath sounds: Normal breath sounds.  Chest:     Chest wall: No tenderness.  Abdominal:     General: Bowel sounds are normal.     Palpations: Abdomen is soft.     Tenderness: There is abdominal tenderness (epigastric).  Skin:    General: Skin is warm and dry.  Neurological:     General: No focal  deficit present.     Cranial Nerves: No cranial nerve deficit.     Motor: No weakness.  Psychiatric:        Mood and Affect: Mood normal.        Behavior: Behavior normal.     ED Results / Procedures / Treatments   Labs (all labs ordered are listed, but only abnormal results are displayed) Labs Reviewed  COMPREHENSIVE METABOLIC PANEL - Abnormal;  Notable for the following components:      Result Value   AST 13 (*)    Total Bilirubin 1.7 (*)    All other components within normal limits  SARS CORONAVIRUS 2 (TAT 6-24 HRS)  LIPASE, BLOOD  CBC  URINALYSIS, ROUTINE W REFLEX MICROSCOPIC  TROPONIN I (HIGH SENSITIVITY)  TROPONIN I (HIGH SENSITIVITY)    EKG EKG Interpretation  Date/Time:  Tuesday October 11 2019 10:00:07 EDT Ventricular Rate:  65 PR Interval:  92 QRS Duration: 102 QT Interval:  412 QTC Calculation: 428 R Axis:   95 Text Interpretation: Sinus rhythm with short PR Rightward axis Incomplete right bundle branch block Borderline ECG poor baseline Confirmed by Elnora Morrison [phone]) on 10/11/2019 10:56:00 AM   Radiology DG Chest 2 View  Result Date: 10/11/2019 CLINICAL DATA:  Chest pain, weakness EXAM: CHEST - 2 VIEW COMPARISON:  07/29/2014 FINDINGS: The heart size and mediastinal contours are within normal limits. Both lungs are clear. The visualized skeletal structures are unremarkable. IMPRESSION: No active cardiopulmonary disease. Electronically Signed   By: Davina Poke D.O.   On: 10/11/2019 12:51   CT Abdomen Pelvis W Contrast  Result Date: 10/11/2019 CLINICAL DATA:  Epigastric abdominal pain and unintended weight loss. Nausea, vomiting and diarrhea since Saturday. EXAM: CT ABDOMEN AND PELVIS WITH CONTRAST TECHNIQUE: Multidetector CT imaging of the abdomen and pelvis was performed using the standard protocol following bolus administration of intravenous contrast. CONTRAST:  141mL OMNIPAQUE IOHEXOL 300 MG/ML  SOLN COMPARISON:  None. FINDINGS: Lower chest: The lung  bases are clear of acute process. No pleural effusion or pulmonary lesions. The heart is normal in size. No pericardial effusion. The distal esophagus and aorta are unremarkable. Hepatobiliary: No hepatic lesions or intrahepatic biliary dilatation. The gallbladder is normal. No common bile duct dilatation. Pancreas: No mass, inflammation or ductal dilatation. Spleen: Normal size.  No focal lesions. Adrenals/Urinary Tract: The adrenal glands and kidneys are unremarkable. No renal lesions or renal calculi. The bladder is unremarkable. Stomach/Bowel: The stomach, duodenum, small bowel and colon are grossly normal without oral contrast. No acute inflammatory changes, mass lesions or obstructive findings. The appendix is not identified but I do not see any findings suspicious for appendicitis. Vascular/Lymphatic: The aorta is normal in caliber. No dissection. The branch vessels are patent. The major venous structures are patent. No mesenteric or retroperitoneal mass or adenopathy. Small scattered lymph nodes are noted. Reproductive: The prostate gland and seminal vesicles are unremarkable. Other: No pelvic mass or adenopathy. No free pelvic fluid collections. No inguinal mass or adenopathy. No abdominal wall hernia or subcutaneous lesions. Musculoskeletal: No significant bony findings. IMPRESSION: No acute abdominal/pelvic findings, mass lesions or adenopathy. Electronically Signed   By: Marijo Sanes M.D.   On: 10/11/2019 13:52    Procedures Procedures (including critical care time)  Medications Ordered in ED Medications  sodium chloride flush (NS) 0.9 % injection 3 mL (3 mLs Intravenous Not Given 10/11/19 1019)  sodium chloride 0.9 % bolus 1,000 mL (0 mLs Intravenous Stopped 10/11/19 1354)  fentaNYL (SUBLIMAZE) injection 40 mcg (40 mcg Intravenous Given 10/11/19 1230)  iohexol (OMNIPAQUE) 300 MG/ML solution 100 mL (100 mLs Intravenous Contrast Given 10/11/19 1330)    ED Course  I have reviewed the triage  vital signs and the nursing notes.  Pertinent labs & imaging results that were available during my care of the patient were reviewed by me and considered in my medical decision making (see chart for details).    MDM Rules/Calculators/A&P  44 year old male with past medical history significant for hiatal hernia and a previous pneumothorax approximately 7 years ago presenting with few days of nausea, vomiting, shortness of breath, chest pain.  Patient also admits to unintended weight loss of greater than 20 pounds in the past month and a half.  Per chart review patient weighed approximately 125 pounds in 2016 and currently weighs 119.  Will check CBC, CMP, troponin, repeat EKG due to artifact, will get chest x-ray, lipase, urinalysis.  Will provide 1 L fluid bolus as patient appears fluid down, monitor cardiac and pulse ox continuous.  Patient with some complaints of pain, will give 40 mcg fentanyl.  Troponin negative at 2.  Patient with a heart rate in the high 40s, low 50s but asymptomatic at this time.  Specifically denies lightheadedness, dizziness, feelings of presyncope.  States he actually feels better after receiving some of the fluids earlier.  Chest x-ray with no active cardiopulmonary disease.  We will plan to get CT abdomen pelvis.  CT scan without acute findings.  No mass lesions or adenopathy same.  Patient endorses that he does feel better after the fluid bolus.  We will plan to discharge home with return precautions provided prior to discharge.  Patient symptoms most likely related to gastroenteritis.  Concern for chest pain being of cardiac etiology is less likely with normal troponins and negative chest x-ray.  Unlikely that his chest pain and shortness of breath symptoms are due to pulmonary embolism with a well score of 0. Patients unintentional weight loss is concerning, however patient had negative chest x-ray, negative CT abdomen pelvis.  Patient endorses  nausea and vomiting on a regular basis and patient may have simply became dehydrated with his active status leading to his shortness of breath and chest pain.  I do think it is important for patient to follow-up with a primary care physician for overall management of his weight loss and further work-up.    Final Clinical Impression(s) / ED Diagnoses Final diagnoses:  Nausea  Intractable vomiting with nausea, unspecified vomiting type  Atypical chest pain    Rx / DC Orders ED Discharge Orders    None       Lurline Del, DO 10/11/19 1517    Elnora Morrison, MD 10/11/19 [phone]

## 2019-11-09 ENCOUNTER — Emergency Department (HOSPITAL_COMMUNITY)
Admission: EM | Admit: 2019-11-09 | Discharge: 2019-11-09 | Disposition: A | Discharge status: Home / Self Care | Payer: Self-pay | Attending: Emergency Medicine | Admitting: Emergency Medicine

## 2019-11-09 ENCOUNTER — Emergency Department (HOSPITAL_COMMUNITY): Payer: Self-pay

## 2019-11-09 ENCOUNTER — Other Ambulatory Visit: Payer: Self-pay

## 2019-11-09 ENCOUNTER — Encounter (HOSPITAL_COMMUNITY): Payer: Self-pay | Admitting: *Deleted

## 2019-11-09 DIAGNOSIS — M25461 Effusion, right knee: Secondary | ICD-10-CM | POA: Insufficient documentation

## 2019-11-09 MED ORDER — IBUPROFEN 600 MG PO TABS
600.0000 mg | ORAL_TABLET | Freq: Four times a day (QID) | ORAL | 0 refills | Status: DC | PRN
Start: 2019-11-09 — End: 2019-11-15

## 2019-11-09 MED ORDER — IBUPROFEN 800 MG PO TABS
800.0000 mg | ORAL_TABLET | Freq: Once | ORAL | Status: AC
  Administered 2019-11-09: 800 mg via ORAL
  Filled 2019-11-09: qty 1

## 2019-11-09 MED ORDER — HYDROCODONE-ACETAMINOPHEN 5-325 MG PO TABS: 1.0000 | ORAL_TABLET | ORAL | 0 refills | Status: DC | PRN

## 2019-11-09 MED ORDER — HYDROCODONE-ACETAMINOPHEN 5-325 MG PO TABS
1.0000 | ORAL_TABLET | Freq: Once | ORAL | Status: AC
  Administered 2019-11-09: 1 via ORAL
  Filled 2019-11-09: qty 1

## 2019-11-09 NOTE — ED Provider Notes (Signed)
[facility] EMERGENCY DEPARTMENT Provider Note   CSN: [id number] Arrival date & time: 11/09/19  1658     History Chief Complaint  Patient presents with  . Knee Pain    Allen Ward is a 44 y.o. male presenting for evaluation of right knee pain and swelling.  He reports he was horsing around on a piece of outdoor power equipment when he fell and caught his leg in some wires and briefly tangled by his right leg and twisted his knee in the process.  Since then he has had pain and swelling of the knee joint.  He states the swelling was worse than currently but he has been applying ice and elevating the leg since yesterday.  He denies weakness or numbness distal to the injury site.  He does endorse pain that radiates to his anterior upper shin with attempts at ranging the joint.  He has been unable to weight-bear secondary to pain.  The history is provided by the patient and the spouse.       Past Medical History:  Diagnosis Date  . Hiatal hernia   . Pneumothorax     Patient Active Problem List   Diagnosis Date Noted  . Pneumothorax 03/21/2013    Past Surgical History:  Procedure Laterality Date  . CHEST TUBE INSERTION         Family History  Problem Relation Age of Onset  . Osteoporosis Mother   . Heart disease Mother   . Heart attack Father   . Heart disease Father   . Obesity Sister   . Hypertension Sister   . Diabetes Sister   . Cancer Sister     Social History   Tobacco Use  . Smoking status: Never Smoker  . Smokeless tobacco: Never Used  Substance Use Topics  . Alcohol use: No  . Drug use: Yes    Types: Marijuana    Comment: last used this morning 01/07/14    Home Medications Prior to Admission medications   Medication Sig Start Date End Date Taking? Authorizing Provider  acyclovir (ZOVIRAX) 200 MG capsule Take 4 capsules (800 mg total) by mouth 5 (five) times daily. 01/07/14   Ashley Murrain, NP  cyclopentolate (CYCLOGYL) 1 % ophthalmic solution  Place 1 drop into the left eye 3 (three) times daily. 01/07/14   Ashley Murrain, NP  esomeprazole (NEXIUM) 20 MG capsule Take 20 mg by mouth daily at 12 noon.    [provider]  HYDROcodone-acetaminophen (NORCO/VICODIN) 5-325 MG tablet Take 1 tablet by mouth every 4 (four) hours as needed for moderate pain. 11/09/19   Evalee Jefferson, PA-C  ibuprofen (ADVIL) 600 MG tablet Take 1 tablet (600 mg total) by mouth every 6 (six) hours as needed. 11/09/19   Evalee Jefferson, PA-C  ondansetron (ZOFRAN ODT) 4 MG disintegrating tablet Take 1 tablet (4 mg total) by mouth every 8 (eight) hours as needed for nausea or vomiting. 07/29/14   Horton, Barbette Hair, MD  oxyCODONE-acetaminophen (ROXICET) 5-325 MG per tablet Take 1 tablet by mouth every 4 (four) hours as needed for severe pain. 01/07/14   Ashley Murrain, NP  trifluridine (VIROPTIC) 1 % ophthalmic solution Place 1 drop into the left eye every 3 (three) hours. 01/07/14   Ashley Murrain, NP    Allergies    Amoxicillin, Penicillins, and Codeine  Review of Systems   Review of Systems  Constitutional: Negative for fever.  Musculoskeletal: Positive for arthralgias and joint swelling. Negative for myalgias.  Neurological: Negative for weakness and numbness.  All other systems reviewed and are negative.   Physical Exam Updated Vital Signs BP 125/75 (BP Location: Right Arm)   Pulse 92   Temp 98.4 F (36.9 C) (Oral)   Resp 15   Ht 6\' 1"  (1.854 m)   Wt 56.7 kg   SpO2 100%   BMI 16.49 kg/m   Physical Exam Constitutional:      Appearance: He is well-developed.  HENT:     Head: Atraumatic.  Cardiovascular:     Comments: Pulses equal bilaterally Musculoskeletal:        General: Tenderness present.     Cervical back: Normal range of motion.     Right knee: Effusion and bony tenderness present. No erythema or ecchymosis. Decreased range of motion.     Comments: Tired he right knee joint is fairly tender, but localizes to the right lateral knee joint  space.  There is no palpable deformity.  He has no obvious LCL or MCL laxity.  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  Skin:    General: Skin is warm and dry.  Neurological:     Mental Status: He is alert.     Sensory: No sensory deficit.     Deep Tendon Reflexes: Reflexes normal.     ED Results / Procedures / Treatments   Labs (all labs ordered are listed, but only abnormal results are displayed) Labs Reviewed - No data to display  EKG None  Radiology DG Knee Complete 4 Views Right  Result Date: 11/09/2019 CLINICAL DATA:  Right knee pain following twisting injury, initial encounter EXAM: RIGHT KNEE - COMPLETE 4+ VIEW COMPARISON:  None. FINDINGS: No acute fracture or dislocation is noted. Mild anterior medial soft tissue swelling is seen. Small joint effusion is noted. No other focal abnormality is noted. IMPRESSION: Small joint effusion with associated soft tissue swelling. No acute bony abnormality is noted. Electronically Signed   By: Inez Catalina M.D.   On: 11/09/2019 17:57    Procedures Procedures (including critical care time)  Medications Ordered in ED Medications  ibuprofen (ADVIL) tablet 800 mg (800 mg Oral Given 11/09/19 2155)  HYDROcodone-acetaminophen (NORCO/VICODIN) 5-325 MG per tablet 1 tablet (1 tablet Oral Given 11/09/19 2155)    ED Course  I have reviewed the triage vital signs and the nursing notes.  Pertinent labs & imaging results that were available during my care of the patient were reviewed by me and considered in my medical decision making (see chart for details).    MDM Rules/Calculators/A&P                          Patient with right knee joint effusion.  Imaging reviewed and discussed with him and his wife at the bedside.  Discussed possible soft tissue injuries including ligament and/or meniscal.  He was placed in a knee immobilizer, crutches given.  Discussed continued RICE therapy.  Referral to Dr. Aline Brochure for follow-up  care.  He was prescribed ibuprofen along with a small quantity of hydrocodone after review of the [place] narcotic database.  Final Clinical Impression(s) / ED Diagnoses Final diagnoses:  Effusion of right knee    Rx / DC Orders ED Discharge Orders         Ordered    ibuprofen (ADVIL) 600 MG tablet  Every 6 hours PRN        11/09/19 2113    HYDROcodone-acetaminophen (NORCO/VICODIN) 5-325 MG  tablet  Every 4 hours PRN        11/09/19 2113           Landis Martins 11/10/19 2039    Varney Biles, MD 11/12/19 [phone]

## 2019-11-09 NOTE — ED Triage Notes (Signed)
Pt c/o right knee pain after twisting right knee this am,

## 2019-11-09 NOTE — Discharge Instructions (Signed)
As discussed you have a significant effusion in your right knee, this can be a sign of a soft tissue injury such as a ligament or cartilage injury.  You are being asked to minimize any movement of the knee by wearing the brace as much as possible.  The more you flex the knee the more the fluid may reaccumulate.  Elevate, ice, medications as prescribed.  Do not drive within 4 hours of taking hydrocodone as this medication will make you drowsy.  Call Dr. Aline Brochure for further evaluation of this injury.

## 2019-11-10 ENCOUNTER — Encounter: Payer: Self-pay | Admitting: Orthopedic Surgery

## 2019-11-10 ENCOUNTER — Ambulatory Visit (INDEPENDENT_AMBULATORY_CARE_PROVIDER_SITE_OTHER): Payer: Self-pay | Admitting: Orthopedic Surgery

## 2019-11-10 VITALS — BP 92/66 | HR 84 | Ht 73.0 in | Wt 125.0 lb

## 2019-11-10 DIAGNOSIS — S8011XA Contusion of right lower leg, initial encounter: Secondary | ICD-10-CM

## 2019-11-10 DIAGNOSIS — M25461 Effusion, right knee: Secondary | ICD-10-CM

## 2019-11-10 MED ORDER — OXYCODONE-ACETAMINOPHEN 5-325 MG PO TABS: 1.0000 | ORAL_TABLET | ORAL | 0 refills | Status: DC | PRN

## 2019-11-10 NOTE — Progress Notes (Signed)
NEW PROBLEM//OFFICE VISIT  Chief Complaint  Patient presents with  . Knee Pain    PAtient fell on 11/08/2019, had x-rays, right knee, swelling and comes and goes   Meds ordered this encounter  Medications  . oxyCODONE-acetaminophen (PERCOCET/ROXICET) 5-325 MG tablet    Sig: Take 1 tablet by mouth every 4 (four) hours as needed for up to 5 days for severe pain.    Dispense:  30 tablet    Refill:  0   .medsc Current Outpatient Medications  Medication Instructions  . HYDROcodone-acetaminophen (NORCO/VICODIN) 5-325 MG tablet 1 tablet, Oral, Every 4 hours PRN  . ibuprofen (ADVIL) 600 mg, Oral, Every 6 hours PRN  . 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  Neurological: Positive for tingling.     Past Medical History:  Diagnosis Date  . Hiatal hernia   . Pneumothorax     Past Surgical History:  Procedure Laterality Date  . CHEST TUBE INSERTION      Family History  Problem Relation Age of Onset  . Osteoporosis Mother   . Heart disease Mother   . Heart attack Father   . Heart disease Father   . Obesity Sister   . Hypertension Sister   . Diabetes Sister   . Cancer Sister    Social History   Tobacco Use  . Smoking status: Never Smoker  . Smokeless tobacco: Never Used  Substance Use Topics  . Alcohol use: No  . Drug use: Yes    Types: Marijuana    Comment: last used this morning 01/07/14    Allergies  Allergen Reactions  . Amoxicillin Hives and Shortness Of Breath  .  Penicillins Hives and Shortness Of Breath  . Codeine Other (See Comments)    Unknown reaction    Current Meds  Medication Sig  . HYDROcodone-acetaminophen (NORCO/VICODIN) 5-325 MG tablet Take 1 tablet by mouth every 4 (four) hours as needed for moderate pain.  Marland Kitchen ibuprofen (ADVIL) 600 MG tablet Take 1 tablet (600 mg total) by mouth every 6 (six) hours as needed.  . [DISCONTINUED] acyclovir (ZOVIRAX) 200 MG capsule Take 4 capsules (800 mg total) by mouth 5 (five) times daily.  . [DISCONTINUED] cyclopentolate (CYCLOGYL) 1 % ophthalmic solution Place 1 drop into the left eye 3 (three) times daily.  . [DISCONTINUED] esomeprazole (NEXIUM) 20 MG capsule Take 20 mg by mouth daily at 12 noon.  . [DISCONTINUED] ondansetron (ZOFRAN ODT) 4 MG disintegrating tablet Take 1 tablet (4 mg total) by mouth every 8 (eight) hours as needed for nausea or vomiting.  . [DISCONTINUED] oxyCODONE-acetaminophen (ROXICET) 5-325 MG per tablet Take 1 tablet by mouth every 4 (four) hours as needed for severe pain.  . [DISCONTINUED] trifluridine (VIROPTIC) 1 % ophthalmic solution Place 1 drop into the left eye every 3 (three) hours.    BP 92/66   Pulse 84   Ht 6\' 1"  (1.854 m)   Wt 125 lb (56.7  kg)   BMI 16.49 kg/m   Physical Exam Constitutional:      General: He is not in acute distress.    Appearance: He is well-developed.  Cardiovascular:     Comments: No peripheral edema Skin:    General: Skin is warm and dry.  Neurological:     Mental Status: He is alert and oriented to person, place, and time.     Sensory: No sensory deficit.     Coordination: Coordination normal.     Gait: Gait abnormal.     Deep Tendon Reflexes: Reflexes are normal and symmetric.     Ortho Exam  Right knee effusion minimal range of motion tenderness is on the lateral side of the knee and just below it in the first compartment but the compartments are soft neurovascular exam is intact he has painful range of motion in the ankle on  the lateral compartment as if he has a contusion to the lateral compartment  The knee joint feels stable  No medial tenderness   MEDICAL DECISION MAKING  A.  Encounter Diagnoses  Name Primary?  . Contusion of right lower extremity, initial encounter Yes  . Effusion, right knee     B. DATA ANALYSED:   IMAGING: Interpretation of images: Right knee x-ray was negative for fracture does show contusion  Orders: No new orders  Outside records reviewed: No outside records   C. MANAGEMENT   Weight-bear as tolerated Knee brace Crutches Out of work a week Continue ibuprofen Use ice Start Percocet when Vicodin completed   Current Outpatient Medications:  .  HYDROcodone-acetaminophen (NORCO/VICODIN) 5-325 MG tablet, Take 1 tablet by mouth every 4 (four) hours as needed for moderate pain., Disp: 20 tablet, Rfl: 0 .  ibuprofen (ADVIL) 600 MG tablet, Take 1 tablet (600 mg total) by mouth every 6 (six) hours as needed., Disp: 30 tablet, Rfl: 0 .  oxyCODONE-acetaminophen (PERCOCET/ROXICET) 5-325 MG tablet, Take 1 tablet by mouth every 4 (four) hours as needed for up to 5 days for severe pain., Disp: 30 tablet, Rfl: 0 Meds ordered this encounter  Medications  . oxyCODONE-acetaminophen (PERCOCET/ROXICET) 5-325 MG tablet    Sig: Take 1 tablet by mouth every 4 (four) hours as needed for up to 5 days for severe pain.    Dispense:  30 tablet    Refill:  0     Meds ordered this encounter  Medications  . oxyCODONE-acetaminophen (PERCOCET/ROXICET) 5-325 MG tablet    Sig: Take 1 tablet by mouth every 4 (four) hours as needed for up to 5 days for severe pain.    Dispense:  30 tablet    Refill:  0      Arther Abbott, MD  11/10/2019 4:22 PM

## 2019-11-10 NOTE — Patient Instructions (Signed)
Ice   Brace   Crutches   No work for 1 week  Finish the [employer] the [employer]

## 2019-11-15 ENCOUNTER — Encounter: Payer: Self-pay | Admitting: Orthopedic Surgery

## 2019-11-15 ENCOUNTER — Ambulatory Visit (INDEPENDENT_AMBULATORY_CARE_PROVIDER_SITE_OTHER): Payer: Self-pay | Admitting: Orthopedic Surgery

## 2019-11-15 ENCOUNTER — Other Ambulatory Visit: Payer: Self-pay

## 2019-11-15 VITALS — BP 113/64 | HR 81 | Ht 73.0 in | Wt 125.0 lb

## 2019-11-15 DIAGNOSIS — S8011XA Contusion of right lower leg, initial encounter: Secondary | ICD-10-CM

## 2019-11-15 MED ORDER — OXYCODONE-ACETAMINOPHEN 5-325 MG PO TABS: 1.0000 | ORAL_TABLET | ORAL | 0 refills | Status: AC | PRN

## 2019-11-15 MED ORDER — PROMETHAZINE HCL 12.5 MG PO TABS
12.5000 mg | ORAL_TABLET | Freq: Four times a day (QID) | ORAL | 0 refills | Status: DC | PRN
Start: 2019-11-15 — End: 2020-03-22

## 2019-11-15 MED ORDER — IBUPROFEN 600 MG PO TABS: 600.0000 mg | ORAL_TABLET | Freq: Four times a day (QID) | ORAL | 0 refills | Status: DC | PRN

## 2019-11-15 NOTE — Progress Notes (Signed)
Progress Note   Patient ID: Allen Ward, male   DOB: 07/15/1975, 44 y.o.   MRN: [id number]  Body mass index is 16.49 kg/m.  Chief Complaint  Patient presents with  . Knee Pain    right/ 11/08/19     Encounter Diagnosis  Name Primary?  . Contusion of right lower extremity, initial encounter Yes    HPI 44 year old male injured his right leg, slightly better this week but still having trouble weightbearing.  Most of his pain is lateral near the fibular head he has painful dorsiflexion of the right foot  Pain is relieved with ibuprofen Percocet but Percocet is causing nausea  Review of Systems  Neurological: Negative for tingling and sensory change.      BP 113/64   Pulse 81   Ht 6\' 1"  (1.854 m)   Wt 125 lb (56.7 kg)   BMI 16.49 kg/m   Physical Exam  Right knee no joint tenderness the pain and tenderness over the fibular head and in the lateral compartment of the lower leg he has pain with dorsiflexion of the foot in the same area  Collateral ligaments are stable to varus valgus stress testing cruciate ligaments are also stable to anterior posterior stress testing  Norm sensation  No evidence of foot drop [place] Encounter Diagnosis  Name Primary?  . Contusion of right lower extremity, initial encounter Yes    DATA ANALYSED:  C. MANAGEMENT   Switch to range of motion brace  Encourage weightbearing as tolerated  Ankle range of motion 100 times a day knee range of motion 50 times a day  E[employer]y hinged brace  Follow-up in 2 weeks  Medications were refilled  Meds ordered this encounter  Medications  . promethazine (PHENERGAN) 12.5 MG tablet    Sig: Take 1 tablet (12.5 mg total) by mouth every 6 (six) hours as needed for nausea or vomiting.    Dispense:  30 tablet    Refill:  0  . oxyCODONE-acetaminophen (PERCOCET/ROXICET) 5-325 MG tablet    Sig: Take 1 tablet by mouth every 4 (four) hours as needed for up to 5 days for severe pain.     Dispense:  30 tablet    Refill:  0  . ibuprofen (ADVIL) 600 MG tablet    Sig: Take 1 tablet (600 mg total) by mouth every 6 (six) hours as needed.    Dispense:  30 tablet    Refill:  0    Arther Abbott, MD 11/15/2019 4:26 PM

## 2019-11-15 NOTE — Patient Instructions (Signed)
Ankle Motion 100 x a day   Knee motion 50 x a day   Continue ice

## 2019-12-01 ENCOUNTER — Encounter: Payer: Self-pay | Admitting: Orthopedic Surgery

## 2019-12-01 ENCOUNTER — Ambulatory Visit: Payer: Self-pay | Admitting: Orthopedic Surgery

## 2019-12-07 ENCOUNTER — Ambulatory Visit: Payer: Self-pay | Admitting: Orthopedic Surgery

## 2020-01-09 ENCOUNTER — Emergency Department (HOSPITAL_COMMUNITY)
Admission: EM | Admit: 2020-01-09 | Discharge: 2020-01-09 | Disposition: A | Discharge status: Home / Self Care | Payer: Self-pay | Attending: Emergency Medicine | Admitting: Emergency Medicine

## 2020-01-09 ENCOUNTER — Emergency Department (HOSPITAL_COMMUNITY): Payer: Self-pay

## 2020-01-09 ENCOUNTER — Other Ambulatory Visit: Payer: Self-pay

## 2020-01-09 ENCOUNTER — Encounter (HOSPITAL_COMMUNITY): Payer: Self-pay | Admitting: Emergency Medicine

## 2020-01-09 DIAGNOSIS — M7041 Prepatellar bursitis, right knee: Secondary | ICD-10-CM | POA: Insufficient documentation

## 2020-01-09 DIAGNOSIS — Y9389 Activity, other specified: Secondary | ICD-10-CM | POA: Insufficient documentation

## 2020-01-09 DIAGNOSIS — X503XXA Overexertion from repetitive movements, initial encounter: Secondary | ICD-10-CM | POA: Insufficient documentation

## 2020-01-09 MED ORDER — KETOROLAC TROMETHAMINE 60 MG/2ML IM SOLN
60.0000 mg | Freq: Once | INTRAMUSCULAR | Status: AC
  Administered 2020-01-09: 60 mg via INTRAMUSCULAR
  Filled 2020-01-09: qty 2

## 2020-01-09 NOTE — ED Provider Notes (Signed)
[facility] EMERGENCY DEPARTMENT Provider Note   CSN: [id number] Arrival date & time: 01/09/20  1851     History Chief Complaint  Patient presents with  . Knee Pain    Allen Ward is a 44 y.o. male.  HPI 44 year old male with right knee swelling x3 days.  Patient states that he has a history of injury to this right knee, followed by Dr. Sheran Lawless here in [place].  He states that he was crawling around on his hands and knees at work and started to note some swelling to the right knee.  Swelling had increased, and he started to have some mild pain to the right knee which prompted him to come to the ER.  He denies any fevers or chills.  No prior history of gout or septic joint.  He denies any history of IV drug use.  Denies any numbness or tingling to his lower extremities.    Past Medical History:  Diagnosis Date  . Hiatal hernia   . Pneumothorax     Patient Active Problem List   Diagnosis Date Noted  . Pneumothorax 03/21/2013    Past Surgical History:  Procedure Laterality Date  . CHEST TUBE INSERTION         Family History  Problem Relation Age of Onset  . Osteoporosis Mother   . Heart disease Mother   . Heart attack Father   . Heart disease Father   . Obesity Sister   . Hypertension Sister   . Diabetes Sister   . Cancer Sister     Social History   Tobacco Use  . Smoking status: Never Smoker  . Smokeless tobacco: Never Used  Substance Use Topics  . Alcohol use: No  . Drug use: Yes    Types: Marijuana    Comment: last used this morning 01/07/14    Home Medications Prior to Admission medications   Medication Sig Start Date End Date Taking? Authorizing Provider  ibuprofen (ADVIL) 600 MG tablet Take 1 tablet (600 mg total) by mouth every 6 (six) hours as needed. 11/15/19   Carole Civil, MD  promethazine (PHENERGAN) 12.5 MG tablet Take 1 tablet (12.5 mg total) by mouth every 6 (six) hours as needed for nausea or vomiting. 11/15/19   Carole Civil, MD    Allergies    Amoxicillin, Penicillins, and Codeine  Review of Systems   Review of Systems  Constitutional: Negative for chills and fever.  Musculoskeletal: Positive for arthralgias and joint swelling.  Neurological: Negative for weakness and numbness.    Physical Exam Updated Vital Signs BP 125/64   Pulse (!) 58   Temp 97.9 F (36.6 C)   Resp 17   Ht 6\' 1"  (1.854 m)   Wt 58.1 kg   SpO2 100%   BMI 16.89 kg/m   Physical Exam Vitals and nursing note reviewed.  Constitutional:      General: He is not in acute distress.    Appearance: He is well-developed. He is not ill-appearing, toxic-appearing or diaphoretic.  HENT:     Head: Normocephalic and atraumatic.  Eyes:     Conjunctiva/sclera: Conjunctivae normal.  Cardiovascular:     Rate and Rhythm: Normal rate and regular rhythm.     Pulses: Normal pulses.     Heart sounds: Normal heart sounds. No murmur heard.   Pulmonary:     Effort: Pulmonary effort is normal. No respiratory distress.     Breath sounds: Normal breath sounds.  Abdominal:  General: Abdomen is flat.     Palpations: Abdomen is soft.     Tenderness: There is no abdominal tenderness.  Musculoskeletal:        General: Swelling and tenderness present. No deformity or signs of injury.     Cervical back: Neck supple.     Right lower leg: No edema.     Left lower leg: No edema.     Comments: Right knee with palpable effusion.  Mildly warm, no significant overlying erythema.  Full range of motion including flexion and extension of right knee.  2+ DP pulses.  Patient can extend knee fully.  Not overly tender to palpation.  5/5 strength with flexion extension.  Neurovascularly intact.  Skin:    General: Skin is warm and dry.     Findings: No erythema or rash.  Neurological:     General: No focal deficit present.     Mental Status: He is alert and oriented to person, place, and time.     Sensory: No sensory deficit.     Motor: No weakness.      ED Results / Procedures / Treatments   Labs (all labs ordered are listed, but only abnormal results are displayed) Labs Reviewed - No data to display  EKG None  Radiology DG Knee Complete 4 Views Right  Result Date: 01/09/2020 CLINICAL DATA:  Right knee swelling since Friday EXAM: RIGHT KNEE - COMPLETE 4+ VIEW COMPARISON:  Radiograph 11/09/2019 FINDINGS: Significant soft tissue thickening anterior to the patella and likely reflecting some edematous soft tissue thickening and swelling of the prepatellar bursa. No acute bony abnormality. Specifically, no fracture, subluxation, or dislocation. Stable appearance of what is likely a benign bone island in the proximal tibia. IMPRESSION: 1. Significant soft tissue thickening anterior to the patella with some edematous soft tissue swelling. Correlate for prepatellar bursitis. 2. No acute osseous abnormality. Electronically Signed   By: Lovena Le M.D.   On: 01/09/2020 20:05    Procedures Procedures (including critical care time)  Medications Ordered in ED Medications  ketorolac (TORADOL) injection 60 mg (60 mg Intramuscular Given 01/09/20 2010)    ED Course  I have reviewed the triage vital signs and the nursing notes.  Pertinent labs & imaging results that were available during my care of the patient were reviewed by me and considered in my medical decision making (see chart for details).    MDM Rules/Calculators/A&P                          Pt with moderate swelling to the joint spaces, knee swelling, tightness in the knee, and mildly restricted range of motion. Pt able to perform full flexion of the knee without difficulty.  Pt is without systemic symptoms, erythema or redness of the joint consistent with gout or septic joint.  Patient X-Ray negative for obvious fracture or dislocation. Xray suggestive of prepatellar bursitis which seems consistent given patient was recently crawling on his knees. Pt advised to follow up with Dr.  Nehemiah Settle for therapeutic knee aspiration.Return precautions discussed. Patient given brace while in ED, conservative therapy recommended and discussed. Patient will be dc home & is agreeable with above plan.   Final Clinical Impression(s) / ED Diagnoses Final diagnoses:  Prepatellar bursitis of right knee    Rx / DC Orders ED Discharge Orders    None       Lyndel Safe 01/09/20 2015    Isla Pence, MD 01/09/20  2048  

## 2020-01-09 NOTE — ED Triage Notes (Signed)
Pt c/o right knee pain with swelling since Friday.

## 2020-01-09 NOTE — Discharge Instructions (Addendum)
Your work-up today was overall reassuring.  Please follow-up with Dr. Nehemiah Settle for the symptoms you are experiencing today. Please wear the knee sleeve, take Tylenol or ibuprofen for pain.  Do not kneel or put pressure on your knee. Return to the ER for worsening pain, fevers, chills, inability to bend your knee, etc.

## 2020-01-15 ENCOUNTER — Encounter (HOSPITAL_COMMUNITY): Payer: Self-pay

## 2020-01-15 ENCOUNTER — Other Ambulatory Visit: Payer: Self-pay

## 2020-01-15 ENCOUNTER — Emergency Department (HOSPITAL_COMMUNITY)
Admission: EM | Admit: 2020-01-15 | Discharge: 2020-01-15 | Disposition: A | Discharge status: Home / Self Care | Payer: Self-pay | Attending: Emergency Medicine | Admitting: Emergency Medicine

## 2020-01-15 ENCOUNTER — Emergency Department (HOSPITAL_COMMUNITY): Payer: Self-pay

## 2020-01-15 DIAGNOSIS — R112 Nausea with vomiting, unspecified: Secondary | ICD-10-CM

## 2020-01-15 DIAGNOSIS — K2970 Gastritis, unspecified, without bleeding: Secondary | ICD-10-CM

## 2020-01-15 DIAGNOSIS — F1590 Other stimulant use, unspecified, uncomplicated: Secondary | ICD-10-CM | POA: Insufficient documentation

## 2020-01-15 DIAGNOSIS — K2971 Gastritis, unspecified, with bleeding: Secondary | ICD-10-CM | POA: Insufficient documentation

## 2020-01-15 LAB — COMPREHENSIVE METABOLIC PANEL
ALT: 11 U/L (ref 0–44)
AST: 12 U/L — ABNORMAL LOW (ref 15–41)
Albumin: 4.6 g/dL (ref 3.5–5.0)
Alkaline Phosphatase: 53 U/L (ref 38–126)
Anion gap: 9 (ref 5–15)
BUN: 18 mg/dL (ref 6–20)
CO2: 27 mmol/L (ref 22–32)
Calcium: 9.1 mg/dL (ref 8.9–10.3)
Chloride: 96 mmol/L — ABNORMAL LOW (ref 98–111)
Creatinine, Ser: 0.95 mg/dL (ref 0.61–1.24)
GFR, Estimated: >60 mL/min (ref >60)
Glucose, Bld: 90 mg/dL (ref 70–99)
Potassium: 4.5 mmol/L (ref 3.5–5.1)
Sodium: 132 mmol/L — ABNORMAL LOW (ref 135–145)
Total Bilirubin: 1.3 mg/dL — ABNORMAL HIGH (ref 0.3–1.2)
Total Protein: 7.3 g/dL (ref 6.5–8.1)

## 2020-01-15 LAB — URINALYSIS, ROUTINE W REFLEX MICROSCOPIC
Bilirubin Urine: NEGATIVE
Glucose, UA: NEGATIVE mg/dL
Ketones, ur: 20 mg/dL — AB
Leukocytes,Ua: NEGATIVE
Nitrite: NEGATIVE
Protein, ur: NEGATIVE mg/dL
Specific Gravity, Urine: 1.035 — ABNORMAL HIGH (ref 1.005–1.030)
pH: 6 (ref 5.0–8.0)

## 2020-01-15 LAB — CBC
HCT: 44.6 % (ref 39.0–52.0)
Hemoglobin: 15.3 g/dL (ref 13.0–17.0)
MCH: 32.6 pg (ref 26.0–34.0)
MCHC: 34.3 g/dL (ref 30.0–36.0)
MCV: 94.9 fL (ref 80.0–100.0)
Platelets: 204 10*3/uL (ref 150–400)
RBC: 4.7 MIL/uL (ref 4.22–5.81)
RDW: 11.9 % (ref 11.5–15.5)
WBC: 6.1 10*3/uL (ref 4.0–10.5)
nRBC: 0 % (ref 0.0–0.2)

## 2020-01-15 LAB — LIPASE, BLOOD: Lipase: 25 U/L (ref 11–51)

## 2020-01-15 MED ORDER — SODIUM CHLORIDE 0.9 % IV BOLUS
500.0000 mL | Freq: Once | INTRAVENOUS | Status: AC
  Administered 2020-01-15: 500 mL via INTRAVENOUS

## 2020-01-15 MED ORDER — FAMOTIDINE IN NACL 20-0.9 MG/50ML-% IV SOLN
20.0000 mg | Freq: Once | INTRAVENOUS | Status: AC
  Administered 2020-01-15: 20 mg via INTRAVENOUS
  Filled 2020-01-15: qty 50

## 2020-01-15 MED ORDER — FAMOTIDINE 20 MG PO TABS: 20.0000 mg | ORAL_TABLET | Freq: Two times a day (BID) | ORAL | 1 refills | Status: DC

## 2020-01-15 MED ORDER — ONDANSETRON HCL 4 MG/2ML IJ SOLN
4.0000 mg | Freq: Once | INTRAMUSCULAR | Status: AC
  Administered 2020-01-15: 4 mg via INTRAVENOUS
  Filled 2020-01-15: qty 2

## 2020-01-15 MED ORDER — IOHEXOL 300 MG/ML  SOLN
100.0000 mL | Freq: Once | INTRAMUSCULAR | Status: AC | PRN
  Administered 2020-01-15: 100 mL via INTRAVENOUS

## 2020-01-15 NOTE — ED Notes (Signed)
Pt transported to CT ?

## 2020-01-15 NOTE — Discharge Instructions (Signed)
Call your gastroenterologist (GI doctor) about your stomach issues.  If you do not have one you can call our clinic at the number above.  For the next 2-3 days you should stick to a liquid diet or soft food like applesauce.  Avoid taking motrin/ibuprofen/advil ("NSAIDS") which can cause stomach upset.

## 2020-01-15 NOTE — ED Provider Notes (Signed)
[facility] Provider Note   CSN: [id number] Arrival date & time: 01/15/20  1323     History Chief Complaint  Patient presents with  . Emesis    Allen Ward is a 44 y.o. male w/ hx of hiatal hernia presenting to the ED with vomiting x 4 days.  The patient was seen in emergency department October 25 with right knee pain and effusion, had negative x-rays at that time, was given an IM shot of Toradol 60 mg in his right hip.  He reports that the next morning woke up feeling nauseated, and has had persistent vomiting for the past 3 to 4 days.  He says anytime he tries to eat or drink any water, he needs to vomit it back up.  He denies any diarrhea and reports he is having regular bowel movements.  He denies any lower abdominal pain.  He denies any abdominal surgical history.  He says he normally does not take any medications including NSAIDs.  He says he "does not like taking meds".  He does tell me that he has a "very sensitive stomach".  He says in the past he has sometimes had episodes of vomiting, but never this persistent.  He does have an orthopedic appointment in 2 to 3 days for his right knee effusion.  He denies any worsening pain and feels like the swelling is gone better in his knee.  He is able to ambulate on this.  He most recently had a CT scan of the abdomen performed in July which showed no acute findings, no biliary sludge or gallstones.  HPI     Past Medical History:  Diagnosis Date  . Hiatal hernia   . Pneumothorax     Patient Active Problem List   Diagnosis Date Noted  . Pneumothorax 03/21/2013    Past Surgical History:  Procedure Laterality Date  . CHEST TUBE INSERTION         Family History  Problem Relation Age of Onset  . Osteoporosis Mother   . Heart disease Mother   . Heart attack Father   . Heart disease Father   . Obesity Sister   . Hypertension Sister   . Diabetes Sister   . Cancer Sister     Social History    Tobacco Use  . Smoking status: Never Smoker  . Smokeless tobacco: Never Used  Substance Use Topics  . Alcohol use: No  . Drug use: Yes    Types: Marijuana    Comment: last used this morning 01/07/14    Home Medications Prior to Admission medications   Medication Sig Start Date End Date Taking? Authorizing Provider  famotidine (PEPCID) 20 MG tablet Take 1 tablet (20 mg total) by mouth 2 (two) times daily. 01/15/20 02/14/20  Wyvonnia Dusky, MD  ibuprofen (ADVIL) 600 MG tablet Take 1 tablet (600 mg total) by mouth every 6 (six) hours as needed. 11/15/19   Carole Civil, MD  promethazine (PHENERGAN) 12.5 MG tablet Take 1 tablet (12.5 mg total) by mouth every 6 (six) hours as needed for nausea or vomiting. 11/15/19   Carole Civil, MD    Allergies    Amoxicillin, Penicillins, Codeine, and Nsaids  Review of Systems   Review of Systems  Constitutional: Negative for chills and fever.  HENT: Negative for ear pain and sore throat.   Eyes: Negative for pain and visual disturbance.  Respiratory: Negative for cough and shortness of breath.   Cardiovascular: Negative for chest  pain and palpitations.  Gastrointestinal: Positive for abdominal pain, nausea and vomiting. Negative for blood in stool, constipation and diarrhea.  Genitourinary: Negative for dysuria and hematuria.  Musculoskeletal: Positive for arthralgias and myalgias.  Skin: Negative for color change and rash.  Neurological: Negative for syncope and light-headedness.  Psychiatric/Behavioral: Negative for agitation and confusion.  All other systems reviewed and are negative.   Physical Exam Updated Vital Signs BP 135/86   Pulse (!) 54   Temp 97.6 F (36.4 C) Comment: Pt complaing of being cold so took temp  Resp 19   Ht 6\' 1"  (1.854 m)   Wt 58.1 kg   SpO2 99%   BMI 16.89 kg/m   Physical Exam Vitals and nursing note reviewed.  Constitutional:      Appearance: He is well-developed.  HENT:     Head:  Normocephalic and atraumatic.  Eyes:     Conjunctiva/sclera: Conjunctivae normal.  Cardiovascular:     Rate and Rhythm: Normal rate and regular rhythm.     Heart sounds: No murmur heard.   Pulmonary:     Effort: Pulmonary effort is normal. No respiratory distress.     Breath sounds: Normal breath sounds.  Abdominal:     Palpations: Abdomen is soft.     Tenderness: There is abdominal tenderness in the epigastric area. There is no guarding or rebound. Negative signs include Murphy's sign and McBurney's sign.  Musculoskeletal:     Cervical back: Neck supple.     Comments: Right knee effusion, soft and minimally tender, peripatellar No erythema, excellent ROM active and passive at the right knee  Skin:    General: Skin is warm and dry.  Neurological:     General: No focal deficit present.     Mental Status: He is alert and oriented to person, place, and time.  Psychiatric:        Mood and Affect: Mood normal.        Behavior: Behavior normal.     ED Results / Procedures / Treatments   Labs (all labs ordered are listed, but only abnormal results are displayed) Labs Reviewed  COMPREHENSIVE METABOLIC PANEL - Abnormal; Notable for the following components:      Result Value   Sodium 132 (*)    Chloride 96 (*)    AST 12 (*)    Total Bilirubin 1.3 (*)    All other components within normal limits  URINALYSIS, ROUTINE W REFLEX MICROSCOPIC - Abnormal; Notable for the following components:   Specific Gravity, Urine 1.035 (*)    Hgb urine dipstick SMALL (*)    Ketones, ur 20 (*)    Bacteria, UA FEW (*)    All other components within normal limits  LIPASE, BLOOD  CBC    EKG None  Radiology CT ABDOMEN PELVIS W CONTRAST  Result Date: 01/15/2020 CLINICAL DATA:  Bowel obstruction suspected. Nausea and vomiting x6 days. EXAM: CT ABDOMEN AND PELVIS WITH CONTRAST TECHNIQUE: Multidetector CT imaging of the abdomen and pelvis was performed using the standard protocol following bolus  administration of intravenous contrast. CONTRAST:  162mL OMNIPAQUE IOHEXOL 300 MG/ML  SOLN COMPARISON:  September 21, 2019 FINDINGS: Lower chest: The lung bases are clear. The heart size is normal. Hepatobiliary: The liver is normal. Normal gallbladder.There is no biliary ductal dilation. Pancreas: Normal contours without ductal dilatation. No peripancreatic fluid collection. Spleen: Unremarkable. Adrenals/Urinary Tract: --Adrenal glands: Unremarkable. --Right kidney/ureter: No hydronephrosis or radiopaque kidney stones. --Left kidney/ureter: No hydronephrosis or radiopaque kidney stones. --Urinary  bladder: Unremarkable. Stomach/Bowel: --Stomach/Duodenum: No hiatal hernia or other gastric abnormality. Normal duodenal course and caliber. --Small bowel: Unremarkable. --Colon: Unremarkable. --Appendix: Not visualized. No right lower quadrant inflammation or free fluid. Vascular/Lymphatic: Normal course and caliber of the major abdominal vessels. 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 The SMA appears to be widely patent. --No retroperitoneal lymphadenopathy. --No mesenteric lymphadenopathy. --No pelvic or inguinal lymphadenopathy. Reproductive: Unremarkable Other: No ascites or free air. The abdominal wall is normal. Musculoskeletal. No acute displaced fractures. IMPRESSION: No acute abdominopelvic abnormality. Electronically Signed   By: Constance Holster M.D.   On: 01/15/2020 16:30    Procedures Procedures (including critical care time)  Medications Ordered in ED Medications  ondansetron (ZOFRAN) injection 4 mg (4 mg Intravenous Given 01/15/20 1558)  famotidine (PEPCID) IVPB 20 mg premix (0 mg Intravenous Stopped 01/15/20 1641)  sodium chloride 0.9 % bolus 500 mL (0 mLs Intravenous Stopped 01/15/20 1642)  iohexol (OMNIPAQUE) 300 MG/ML solution 100 mL (100 mLs Intravenous Contrast Given 01/15/20 1611)    ED Course  I have reviewed the triage vital signs and the nursing notes.  Pertinent labs  & imaging results that were available during my care of the patient were reviewed by me and considered in my medical decision making (see chart for details).  This patient presents to the Emergency Department with complaint of abdominal pain. This involves an extensive number of treatment options, and is a complaint that carries with it a high risk of complications and morbidity.  The differential diagnosis includes gastritis vs constipation vs inflammatory bowel disease vs intraabdominal infection vs other  Most likely gastritis triggered by NSAID/toradol injection in the ED.  However I cannot exclude the possibility of bowel obstruction given her report of persistent vomiting with any attempt to eat or drink.  Plan for CT abd pelvis with IV contrast  Less likely viral gastroenteritis without any diarrhea Less likely appendicitis with this clinical exam  His knee is symptomatically improving.  Low suspicion for septic joint at this time.  He has ortho appointment this week.  I ordered, reviewed, and interpreted labs, including CMP, CBC and Lipase.  There were no immediate, life-threatening emergencies found in this labwork.  Patient's UA showed no signs of infection  I ordered medication IV zofran, IV pepcid, and IV fluids for abdominal pain and nausea I ordered imaging studies which included CT abdomen pelvis with IV contrast I independently visualized and interpreted imaging which showed no acute pathology and the monitor tracing which showed NSR Previous records obtained and reviewed showing CT report from July 2021  Based on the patient's clinical exam, vital signs, risk factors, and ED testing, I felt that the patient's overall risk of life-threatening emergency such as bowel perforation, surgical emergency, or sepsis was quite low.  I suspect this clinical presentation is most consistent with gastritis, but explained to the patient that this evaluation was not a definitive diagnostic  workup.  I discussed outpatient follow up with primary care provider, and provided specialist office number on the patient's discharge paper if a referral was deemed necessary.  I discussed return precautions with the patient. I felt the patient was clinically stable for discharge.  Clinical Course as of Jan 15 109  Sun Jan 15, 2020  1633 IMPRESSION: No acute abdominopelvic abnormality.   [MT]  1633 CT unremarkable - we'll focus on symptomatic management for gastritis or gastroparesis   [MT]  1919 Drank a cup of water at bedside, feeling better.  Will discharge with pepcid script (he has zofran at home).  Advised GI f/u   [MT]    Clinical Course User Index [MT] Nikolos Billig, Carola Rhine, MD   Final Clinical Impression(s) / ED Diagnoses Final diagnoses:  Gastritis, presence of bleeding unspecified, unspecified chronicity, unspecified gastritis type  Non-intractable vomiting with nausea, unspecified vomiting type    Rx / DC Orders ED Discharge Orders         Ordered    famotidine (PEPCID) 20 MG tablet  2 times daily        01/15/20 1924           Wyvonnia Dusky, MD 01/16/20 0110

## 2020-01-15 NOTE — ED Triage Notes (Signed)
Pt presents to ED with complaints with nausea and vomiting since Tuesday. Pt states he had an injection in the ED and ever since he has been nauseated.

## 2020-01-16 ENCOUNTER — Ambulatory Visit (INDEPENDENT_AMBULATORY_CARE_PROVIDER_SITE_OTHER): Payer: Self-pay | Admitting: Orthopedic Surgery

## 2020-01-16 ENCOUNTER — Encounter: Payer: Self-pay | Admitting: Orthopedic Surgery

## 2020-01-16 VITALS — BP 136/92 | HR 74 | Ht 73.0 in | Wt 118.0 lb

## 2020-01-16 DIAGNOSIS — M7041 Prepatellar bursitis, right knee: Secondary | ICD-10-CM

## 2020-01-16 NOTE — Progress Notes (Signed)
Chief Complaint  Patient presents with  . Knee Problem    right knee swelling at patella     44 year old male injured his right knee contusion presents now with a swollen right prepatellar bursa secondary to crawling on his knee  Complains of pain over the bursa  The previous pain that he had from his contusion had resolved although he still wearing a hinged brace as prescribed by the emergency room  No fever no skin changes no erythema just a large amount of swelling over the prepatellar bursa  Past Medical History:  Diagnosis Date  . Hiatal hernia   . Pneumothorax     BP (!) 136/92   Pulse 74   Ht 6\' 1"  (1.854 m)   Wt 118 lb (53.5 kg)   BMI 15.57 kg/m   He is awake alert he is oriented x3 his mood and affect are normal he has normal sensation in his right leg he has good pulse and perfusion his appearance is normal he is thin normal body habitus normal grooming  His gait is unremarkable for any abnormality does have a large prepatellar bursa swelling over the right patella with no erythema knee feels stable strength and muscle tone are normal skin shows no erythema  Outside films read by me:  The x-ray I interpreted the x-ray as normal knee with the exception of a large soft tissue swelling in front of the patella  The joint is maintained there is no fracture dislocation or sign of infection or bone disease I aspirated and injected the prepatellar bursa obtaining 40 cc of hemorrhagic fluid which was thin showed no signs of infection and then we injected cortisone and lidocaine  X-rays of the right knee 4 views from [facility] dated  Encounter Diagnosis  Name Primary?  . Hemorrhagic prepatellar bursitis of right knee Yes     Protection rest ice compression elevation protocol follow-up if recurrence

## 2020-01-16 NOTE — Patient Instructions (Signed)
Try to avoid kneeling crawling  Ice it for 48 hours  Ace bandage for 48 hours  Tylenol or Advil if it is painfull

## 2020-02-02 ENCOUNTER — Ambulatory Visit (INDEPENDENT_AMBULATORY_CARE_PROVIDER_SITE_OTHER): Payer: Self-pay | Admitting: Gastroenterology

## 2020-02-02 ENCOUNTER — Encounter: Payer: Self-pay | Admitting: Gastroenterology

## 2020-02-02 VITALS — BP 124/72 | HR 84 | Ht 73.5 in | Wt 124.0 lb

## 2020-02-02 DIAGNOSIS — R110 Nausea: Secondary | ICD-10-CM

## 2020-02-02 DIAGNOSIS — R109 Unspecified abdominal pain: Secondary | ICD-10-CM

## 2020-02-02 DIAGNOSIS — Z800 Family history of malignant neoplasm of digestive organs: Secondary | ICD-10-CM

## 2020-02-02 MED ORDER — NA SULFATE-K SULFATE-MG SULF 17.5-3.13-1.6 GM/177ML PO SOLN: ORAL | 0 refills | Status: DC

## 2020-02-02 MED ORDER — PANTOPRAZOLE SODIUM 40 MG PO TBEC: 40.0000 mg | DELAYED_RELEASE_TABLET | Freq: Every morning | ORAL | 3 refills | Status: DC

## 2020-02-02 NOTE — Patient Instructions (Signed)
ENDOSCOPY and COLONOSCOPY: You have been scheduled for an endoscopy and a colonoscopy. Please follow written instructions given to you at your visit today.   PREP: Please pick up your prep supplies at the pharmacy within the next 1-3 days.  INHALERS: If you use inhalers (even only as needed), please bring them with you on the day of your procedure.  We have placed you on a recall list for your endoscopy. If we should have any cancellations, we will give you a call.  PRESCRIPTION MEDICATION(S): We have sent the following medication(s) to your pharmacy:  . Pantoprazole 40mg  by mouth every morning . Please continue Famotidine at the present dose  Referral: We will refer you to [facility]  Please try abstain from use of marijuana for at least 2 weeks to see if your symptoms improve.  If you are age 43 or younger, your body mass index should be between 19-25. Your Body mass index is 16.14 kg/m. If this is out of the aformentioned range listed, please consider follow up with your Primary Care Provider.   Thank you for trusting me with your gastrointestinal care!    Thornton Park, MD, MPH  Gastroesophageal Reflux Disease, Adult Gastroesophageal reflux (GER) happens when acid from the stomach flows up into the tube that connects the mouth and the stomach (esophagus). Normally, food travels down the esophagus and stays in the stomach to be digested. With GER, food and stomach acid sometimes move back up into the esophagus. You may have a disease called gastroesophageal reflux disease (GERD) if the reflux:  Happens often.  Causes frequent or very bad symptoms.  Causes problems such as damage to the esophagus. When this happens, the esophagus becomes sore and swollen (inflamed). Over time, GERD can make small holes (ulcers) in the lining of the esophagus. What are the causes? This condition is caused by a problem with the muscle between the esophagus and the stomach. When this  muscle is weak or not normal, it does not close properly to keep food and acid from coming back up from the stomach. The muscle can be weak because of:  Tobacco use.  Pregnancy.  Having a certain type of hernia (hiatal hernia).  Alcohol use.  Certain foods and drinks, such as coffee, chocolate, onions, and peppermint. What increases the risk? You are more likely to develop this condition if you:  Are overweight.  Have a disease that affects your connective tissue.  Use NSAID medicines. What are the signs or symptoms? Symptoms of this condition include:  Heartburn.  Difficult or painful swallowing.  The feeling of having a lump in the throat.  A bitter taste in the mouth.  Bad breath.  Having a lot of saliva.  Having an upset or bloated stomach.  Belching.  Chest pain. Different conditions can cause chest pain. Make sure you see your doctor if you have chest pain.  Shortness of breath or noisy breathing (wheezing).  Ongoing (chronic) cough or a cough at night.  Wearing away of the surface of teeth (tooth enamel).  Weight loss. How is this treated? Treatment will depend on how bad your symptoms are. Your doctor may suggest:  Changes to your diet.  Medicine.  Surgery. Follow these instructions at home: Eating and drinking   Follow a diet as told by your doctor. You may need to avoid foods and drinks such as: ? Coffee and tea (with or without caffeine). ? Drinks that contain alcohol. ? Energy drinks and sports drinks. ?  Bubbly (carbonated) drinks or sodas. ? Chocolate and cocoa. ? Peppermint and mint flavorings. ? Garlic and onions. ? Horseradish. ? Spicy and acidic foods. These include peppers, chili powder, curry powder, vinegar, hot sauces, and BBQ sauce. ? Citrus fruit juices and citrus fruits, such as oranges, lemons, and limes. ? Tomato-based foods. These include red sauce, chili, salsa, and pizza with red sauce. ? Fried and fatty foods. These  include donuts, french fries, potato chips, and high-fat dressings. ? High-fat meats. These include hot dogs, rib eye steak, sausage, ham, and bacon. ? High-fat dairy items, such as whole milk, butter, and cream cheese.  Eat small meals often. Avoid eating large meals.  Avoid drinking large amounts of liquid with your meals.  Avoid eating meals during the 2-3 hours before bedtime.  Avoid lying down right after you eat.  Do not exercise right after you eat. Lifestyle   Do not use any products that contain nicotine or tobacco. These include cigarettes, e-cigarettes, and chewing tobacco. If you need help quitting, ask your doctor.  Try to lower your stress. If you need help doing this, ask your doctor.  If you are overweight, lose an amount of weight that is healthy for you. Ask your doctor about a safe weight loss goal. General instructions  Pay attention to any changes in your symptoms.  Take over-the-counter and prescription medicines only as told by your doctor. Do not take aspirin, ibuprofen, or other NSAIDs unless your doctor says it is okay.  Wear loose clothes. Do not wear anything tight around your waist.  Raise (elevate) the head of your bed about 6 inches (15 cm).  Avoid bending over if this makes your symptoms worse.  Keep all follow-up visits as told by your doctor. This is important. Contact a doctor if:  You have new symptoms.  You lose weight and you do not know why.  You have trouble swallowing or it hurts to swallow.  You have wheezing or a cough that keeps happening.  Your symptoms do not get better with treatment.  You have a hoarse voice. Get help right away if:  You have pain in your arms, neck, jaw, teeth, or back.  You feel sweaty, dizzy, or light-headed.  You have chest pain or shortness of breath.  You throw up (vomit) and your throw-up looks like blood or coffee grounds.  You pass out (faint).  Your poop (stool) is bloody or  black.  You cannot swallow, drink, or eat. Summary  If a person has gastroesophageal reflux disease (GERD), food and stomach acid move back up into the esophagus and cause symptoms or problems such as damage to the esophagus.  Treatment will depend on how bad your symptoms are.  Follow a diet as told by your doctor.  Take all medicines only as told by your doctor. This information is not intended to replace advice given to you by your health care provider. Make sure you discuss any questions you have with your health care provider. Document Revised: 09/09/2017 Document Reviewed: 09/09/2017 Elsevier Patient Education  2020 [employer].  What You Need to Know About Marijuana Use Marijuana is a mixture of the dried leaves and flowers of the hemp plant Cannabis sativa. The plant's active ingredients (cannabinoids) change the chemistry of the brain. If you smoke or eat marijuana, you will experience changes in the way you think, feel, and behave. Many people use marijuana because it helps them relax and puts them in a pleasurable mood (marijuana  high). Some people use marijuana for medical effects, such as:  Reduced nausea.  Increased appetite.  Reduced muscle spasm.  Pain relief. Researchers are studying other possible medical uses for marijuana. How can marijuana use affect me? Many people find a marijuana high to be pleasurable and relaxing. Other people find a marijuana high to be uncomfortable or anxiety-causing. This drug can cause short-term and long-term physical and mental effects. Taking high doses of marijuana or trying to quit marijuana can also affect you. Short-term effects of marijuana use include:  Temporary relief of symptoms from a medical condition.  Changes in mood and perception (feeling high).  Increased hunger.  Increased heart rate.  Slowed movement and reaction time.  Poor memory, judgment, and problem solving ability.  Altered sense of  time.  Changes to vision.  Bloodshot eyes.  Coughing. Long-term effects of marijuana use include:  Higher risk of lung and breathing problems.  Higher risk of heart attack.  Higher risk of testicular cancer.  Mental and physical dependence (addiction).  Slowed brain development in young people. Babies whose mothers used marijuana during pregnancy have an increased risk of problems with brain development and behavior.  Temporary periods of false perceptions or beliefs (hallucinations or paranoia).  Worsening of mental illness.  Onset of new mental illness such as anxiety, depression, or suicidal thoughts.  Withdrawal symptoms when stopping marijuana, such as sleeplessness, anxiety, cravings, and anger.  Difficulty maintaining healthy relationships.  Poor memory, and difficulty concentrating and learning. This can result in decreased intelligence and poor performance at school or work, and an increased risk of dropping out of school.  Higher risk of using other substances like alcohol and nicotine. High doses of marijuana can cause:  Panic.  Anxiety.  Mental confusion.  Hallucinations. Quitting marijuana after using it for a long time can cause withdrawal symptoms, such as:  Headache.  Shakiness.  Sweating.  Stomach pain.  Nausea.  Restlessness.  Irritability.  Trouble sleeping.  Decreased appetite. What are the benefits of not using marijuana? Not using marijuana can keep you from becoming dependent on it. You can avoid the negative effects of the drug that can reduce your quality of life. You can avoid accidents caused by the slowed reaction time that is common with marijuana use. If I already use marijuana, what steps can I take to stop using it? If you are not physically or mentally dependent on marijuana, you should be able to stop using it on your own. If you cannot stop on your own, ask your health care provider for help. Treatment for marijuana  addiction is similar to treatment for other addictions. It may include:  Cognitive-behavioral therapy (psychotherapy). This may include individual or group therapy.  Joining a support group.  Treating medical, behavioral, or mental health conditions that exist along with marijuana dependency.  Where can I get more information? Learn more about:  Marijuana from the [place] [employer] on Drug Abuse: MissingBag.si  Medical marijuana from the [place]: LocatorExpress.is  Treatment options from the Substance Abuse and [place]: https://findtreatment.[employer].com.au  Recovery from marijuana dependency from Recovery.org: http://www.recovery.org/topics/marijuana-recovery When should I seek medical care? Talk with your health care provider if:  You want to stop using marijuana but you cannot.  You have withdrawal symptoms when you try to stop using marijuana.  You are using marijuana every day.  You are using marijuana along with other drugs like cocaine or alcohol.  You have anxiety or depression.  You  have hallucinations or paranoia.  Marijuana use is interfering with your relationships or your ability to function normally at school or at work. Summary  You may become physically or mentally dependent on marijuana.  Long-term use may interfere with your ability to function normally at home, school, or work.  Marijuana addiction is treatable. This information is not intended to replace advice given to you by your health care provider. Make sure you discuss any questions you have with your health care provider. Document Revised: 02/13/2017 Document Reviewed: 11/21/2015 Elsevier Patient Education  [place].

## 2020-02-02 NOTE — Progress Notes (Signed)
Referring Provider: No ref. provider found Primary Care Physician:  Patient, No Pcp Per  Reason for Consultation:  Gastritis   IMPRESSION:  Abdominal pain and nausea with normal liver enzymes, lipase, and CT abd/pelvis x 2 Family history of colon cancer (father in his 50s, paternal grandfather) Longstanding history of GERD  Abdominal pain and nausea with normal liver enzymes, lipase, and CT abd/pelvis x 2. Suspected cyclic vomiting syndrome, hyperemesis cannibis, peptic ulcer disease, H pylori, esophagitis, or gastritis. Less likely malignancy.  EGD recommended to rule out other gastric and intestinal pathology. If negative, consider trial of elavil slowly titrated up and marijuana cessation.  In the meantime, start PPI therapy in addition to his famotidine.   Colonoscopy recommended given his family history of colon cancer.   PLAN: Start pantoprazole 40 mg QAM Continue famotidine at present dose Trial off marijuana for at least 2 weeks to see if symptoms improve GERD lifestyle modifications EGD Colonoscopy Referral to primary care in [place]  Please see the "Patient Instructions" section for addition details about the plan.  HPI: Trejon Duford is a 44 y.o. male referred by the ED for possible gastritis. His wife accompanies him to this appointment. He works for [employer] He has not been able to work for over one month due to his symptoms and recent knee pain.  He has not had the Covid vaccine.   GI symptoms developed at age 60. He has had daily heartburn since that time. Spicy foods, greasy foods, carbonated beverages and dairy may exacerbate his symptoms - but don't keep him from eating or drinking these foods.   Develops intermittent left sided upper abdominal pain with associated nausea and vomiting that occurs once or twice a month. He feels like something pops out on his abdominal wall and then the pain radiates to the umbilicus. During these episodes, the  nausea and vomiting intensify.  When symptoms occur, they last about one week. Relieved by hot shower.  He denies alarm symptoms including anemia, dysphagia, dysphonia, early satiety, hematemesis, hematochezia, melena, odynophagia, and weight loss.   ED evaluation 10/11/19 for nausea and chest pain. CMP, CBC, and CT abd/pelvis were normal.  Seen in the ED 01/15/20 for vomiting. CMP, CBC, lipase, and CT abd/pelvis with contrast were normal.  Discharged home on Pepcid.   Dips and uses marijuana for appetite stimulant since age 75 to gain weight. 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   Recent right knee pain and effusion, treated with Toradol and ibuprofen. Last used one month ago.   Brother had throat cancer at age 64. Father with colon cancer at age 61s. Paternal grandfather with colon cancer (age unknown to the patient) No known family history of colon cancer or polyps. No family history of uterine/endometrial cancer, pancreatic cancer or gastric/stomach cancer.   Past Medical History:  Diagnosis Date  . Hiatal hernia   . Pneumothorax     Past Surgical History:  Procedure Laterality Date  . CHEST TUBE INSERTION      Current Outpatient Medications  Medication Sig Dispense Refill  . famotidine (PEPCID) 20 MG tablet Take 1 tablet (20 mg total) by mouth 2 (two) times daily. 60 tablet 1  . ibuprofen (ADVIL) 600 MG tablet Take 1 tablet (600 mg total) by mouth every 6 (six) hours as needed. 30 tablet 0  . promethazine (PHENERGAN) 12.5 MG tablet Take 1 tablet (12.5 mg total) by mouth every 6 (six) hours as needed for nausea or vomiting. 30 tablet 0  No current facility-administered medications for this visit.    Allergies as of 02/02/2020 - Review Complete 01/16/2020  Allergen Reaction Noted  . Amoxicillin Hives and Shortness Of Breath 03/21/2013  . Penicillins Hives and Shortness Of Breath 11/21/2013  . Codeine Other (See Comments) 11/21/2013  . Nsaids Nausea And Vomiting 01/15/2020     Family History  Problem Relation Age of Onset  . Osteoporosis Mother   . Heart disease Mother   . Heart attack Father   . Heart disease Father   . Obesity Sister   . Hypertension Sister   . Diabetes Sister   . Cancer Sister     Social History   Socioeconomic History  . Marital status: Married    Spouse name: Not on file  . Number of children: Not on file  . Years of education: Not on file  . Highest education level: Not on file  Occupational History  . Not on file  Tobacco Use  . Smoking status: Never Smoker  . Smokeless tobacco: Never Used  Substance and Sexual Activity  . Alcohol use: No  . Drug use: Yes    Types: Marijuana    Comment: last used this morning 01/07/14  . Sexual activity: Not on file  Other Topics Concern  . Not on file  Social History Narrative  . Not on file   Social Determinants of Health   Financial Resource Strain:   . Difficulty of Paying Living Expenses: Not on file  Food Insecurity:   . Worried About Charity fundraiser in the Last Year: Not on file  . Ran Out of Food in the Last Year: Not on file  Transportation Needs:   . Lack of Transportation (Medical): Not on file  . Lack of Transportation (Non-Medical): Not on file  Physical Activity:   . Days of Exercise per Week: Not on file  . Minutes of Exercise per Session: Not on file  Stress:   . Feeling of Stress : Not on file  Social Connections:   . Frequency of Communication with Friends and Family: Not on file  . Frequency of Social Gatherings with Friends and Family: Not on file  . Attends Religious Services: Not on file  . Active Member of Clubs or Organizations: Not on file  . Attends Archivist Meetings: Not on file  . Marital Status: Not on file  Intimate Partner Violence:   . Fear of Current or Ex-Partner: Not on file  . Emotionally Abused: Not on file  . Physically Abused: Not on file  . Sexually Abused: Not on file    Review of Systems: 12 system ROS  is negative except as noted above except for the additions of allergies, back pain, blood in the urine, vision changes. Fatigue. Muscle cramps.   Physical Exam: General:   Alert,  well-nourished, pleasant and cooperative in NAD Head:  Normocephalic and atraumatic. Eyes:  Sclera clear, no icterus.   Conjunctiva pink. Ears:  Normal auditory acuity. Nose:  No deformity, discharge,  or lesions. Mouth:  No deformity or lesions.   Neck:  Supple; no masses or thyromegaly. Lungs:  Clear throughout to auscultation.   No wheezes. Heart:  Regular rate and rhythm; no murmurs. Abdomen:  Soft, thin - nearly scaphoid, nontender, nondistended, normal bowel sounds, no rebound or guarding. No hepatosplenomegaly.   Rectal:  Deferred  Msk:  Symmetrical. No boney deformities LAD: No inguinal or umbilical LAD Extremities:  No clubbing or edema. Neurologic:  Alert and  oriented x4;  grossly nonfocal Skin:  Intact without significant lesions or rashes. Psych:  Alert and cooperative. Normal mood and affect.    Ulani Degrasse L. Tarri Glenn, MD, MPH 02/02/2020, 9:36 AM

## 2020-02-06 ENCOUNTER — Telehealth: Payer: Self-pay

## 2020-02-06 NOTE — Telephone Encounter (Signed)
Called [facility] to follow up on status of referral. States they have been unsuccessful in reaching pt. Attempted to call pt to follow up and to provide with contact # for their office for self scheduling. Also was unable to reach pt.

## 2020-02-16 ENCOUNTER — Ambulatory Visit (INDEPENDENT_AMBULATORY_CARE_PROVIDER_SITE_OTHER): Payer: Self-pay | Admitting: Nurse Practitioner

## 2020-02-16 ENCOUNTER — Other Ambulatory Visit: Payer: Self-pay

## 2020-02-16 ENCOUNTER — Encounter: Payer: Self-pay | Admitting: Nurse Practitioner

## 2020-02-16 VITALS — BP 137/84 | HR 45 | Temp 98.4°F | Resp 16 | Ht 72.0 in | Wt 122.0 lb

## 2020-02-16 DIAGNOSIS — Z7689 Persons encountering health services in other specified circumstances: Secondary | ICD-10-CM

## 2020-02-16 DIAGNOSIS — Z0000 Encounter for general adult medical examination without abnormal findings: Secondary | ICD-10-CM | POA: Insufficient documentation

## 2020-02-16 DIAGNOSIS — R112 Nausea with vomiting, unspecified: Secondary | ICD-10-CM

## 2020-02-16 DIAGNOSIS — R634 Abnormal weight loss: Secondary | ICD-10-CM

## 2020-02-16 DIAGNOSIS — R1012 Left upper quadrant pain: Secondary | ICD-10-CM | POA: Insufficient documentation

## 2020-02-16 DIAGNOSIS — M25561 Pain in right knee: Secondary | ICD-10-CM

## 2020-02-16 MED ORDER — OMEPRAZOLE 40 MG PO CPDR: 40.0000 mg | DELAYED_RELEASE_CAPSULE | Freq: Every day | ORAL | 3 refills | Status: DC

## 2020-02-16 NOTE — Assessment & Plan Note (Signed)
-  will check labs and screen for HCV and HIV

## 2020-02-16 NOTE — Progress Notes (Signed)
New Patient Office Visit  Subjective:  Patient ID: Allen Ward, male    DOB: September 26, 1975  Age: 44 y.o. MRN: [id number]  CC:  Chief Complaint  Patient presents with  . New Patient (Initial Visit)  . Hiatal Hernia    L abdominal pain.     HPI Allen Ward presents for new patient visit. He has not had a recent PCP. No recent labs or physical with PCP.  He has abdominal pain for which he is seeing GI.  He states that he has been unable to eat and to drink due to his pain.  He is seeing a [facility] in [place], and they referred him to [place].   He has a hx of hiatal hernia.  He states he vomited last 02/15/20 at night. He states he vomited while he was eating.  He had an accident at work that caused right knee pain, and he is seeing ortho for this. He states that he had some ligament and nerve damage, but this is feeling better.   Past Medical History:  Diagnosis Date  . Hiatal hernia   . Pneumothorax   . Pneumothorax 03/21/2013    Past Surgical History:  Procedure Laterality Date  . CHEST TUBE INSERTION      Family History  Problem Relation Age of Onset  . Osteoporosis Mother   . Heart disease Mother   . Heart attack Father   . Heart disease Father   . Obesity Sister   . Hypertension Sister   . Diabetes Sister   . Cancer Sister     Social History   Socioeconomic History  . Marital status: Married    Spouse name: Not on file  . Number of children: Not on file  . Years of education: Not on file  . Highest education level: Not on file  Occupational History  . Not on file  Tobacco Use  . Smoking status: Never Smoker  . Smokeless tobacco: Never Used  Substance and Sexual Activity  . Alcohol use: No  . Drug use: Yes    Types: Marijuana    Comment: occasional to help with appetite  . Sexual activity: Not on file  Other Topics Concern  . Not on file  Social History Narrative  . Not on file   Social Determinants of Health   Financial Resource Strain:    . Difficulty of Paying Living Expenses: Not on file  Food Insecurity:   . Worried About Charity fundraiser in the Last Year: Not on file  . Ran Out of Food in the Last Year: Not on file  Transportation Needs:   . Lack of Transportation (Medical): Not on file  . Lack of Transportation (Non-Medical): Not on file  Physical Activity:   . Days of Exercise per Week: Not on file  . Minutes of Exercise per Session: Not on file  Stress:   . Feeling of Stress : Not on file  Social Connections:   . Frequency of Communication with Friends and Family: Not on file  . Frequency of Social Gatherings with Friends and Family: Not on file  . Attends Religious Services: Not on file  . Active Member of Clubs or Organizations: Not on file  . Attends Archivist Meetings: Not on file  . Marital Status: Not on file  Intimate Partner Violence:   . Fear of Current or Ex-Partner: Not on file  . Emotionally Abused: Not on file  . Physically Abused: Not on file  .  Sexually Abused: Not on file    ROS Review of Systems  Constitutional: Positive for appetite change. Negative for fever.       Decreased appetite  Respiratory: Negative.   Cardiovascular: Negative.   Gastrointestinal: Positive for abdominal pain, nausea and vomiting. Negative for constipation and diarrhea.       States he noticed a very small amount of blood in stool.  Musculoskeletal: Positive for arthralgias.       Right knee pain after a work accident is improving    Objective:   Today's Vitals: BP 137/84   Pulse (!) 45   Temp 98.4 F (36.9 C)   Resp 16   Ht 6' (1.829 m)   Wt 122 lb (55.3 kg)   SpO2 98%   BMI 16.55 kg/m   Physical Exam Constitutional:      Appearance: He is ill-appearing.  Cardiovascular:     Rate and Rhythm: Normal rate and regular rhythm.     Heart sounds: Normal heart sounds.  Pulmonary:     Effort: Pulmonary effort is normal.     Breath sounds: Normal breath sounds.  Abdominal:      General: Abdomen is flat.     Palpations: Abdomen is soft. There is no hepatomegaly, splenomegaly, mass or pulsatile mass.     Tenderness: There is abdominal tenderness in the right upper quadrant. There is no guarding or rebound.     Hernia: No hernia is present.    Neurological:     Mental Status: He is alert.     Assessment & Plan:   Problem List Items Addressed This Visit      Digestive   Nausea and vomiting    -he is followed by [facility] -has upcoming EGD and colonoscopy, early January -if symptoms get worse, he should f/u with them -he had recent CT that was negative for hiatal hernia -one differential includes hyperemesis cannabis, but he states that he has not stopped smoking and that this helps his appetite       Relevant Orders   CBC with Differential/Platelet   CMP14+EGFR     Other   Left upper quadrant abdominal pain    -seeing GI -will get labs today -he stopped taking pantoprazole because he states it made his nausea worse -Rx. omeprazole      Abnormal intentional weight loss    -will check a set of labs -he states he has lost about 30 pounds in last 4 months d/t vomiting -check TSH and T4      Relevant Orders   TSH + free T4   Encounter to establish care - Primary    -will check labs and screen for HCV and HIV      Relevant Orders   HCV Ab w/Rflx to Verification   HIV Antibody (routine testing w rflx)   Lipid Panel With LDL/HDL Ratio   Acute pain of right knee      Outpatient Encounter Medications as of 02/16/2020  Medication Sig  . promethazine (PHENERGAN) 12.5 MG tablet Take 1 tablet (12.5 mg total) by mouth every 6 (six) hours as needed for nausea or vomiting.  . famotidine (PEPCID) 20 MG tablet Take 1 tablet (20 mg total) by mouth 2 (two) times daily.  Marland Kitchen omeprazole (PRILOSEC) 40 MG capsule Take 1 capsule (40 mg total) by mouth daily.  . [DISCONTINUED] Na Sulfate-K Sulfate-Mg Sulf 17.5-3.13-1.6 GM/177ML SOLN Apply Coupon=BIN: [id number]  PCN: CN GROUP: [id number] ID: [id number] (Patient not taking: Reported on 02/16/2020)  . [  DISCONTINUED] pantoprazole (PROTONIX) 40 MG tablet Take 1 tablet (40 mg total) by mouth in the morning. (Patient not taking: Reported on 02/16/2020)   No facility-administered encounter medications on file as of 02/16/2020.    Follow-up: Return in about 1 week (around 02/23/2020) for Annual Physical.   Noreene Larsson, NP

## 2020-02-16 NOTE — Assessment & Plan Note (Signed)
-  he is followed by [facility] -has upcoming EGD and colonoscopy, early January -if symptoms get worse, he should f/u with them -he had recent CT that was negative for hiatal hernia -one differential includes hyperemesis cannabis, but he states that he has not stopped smoking and that this helps his appetite

## 2020-02-16 NOTE — Assessment & Plan Note (Signed)
-  will check a set of labs -he states he has lost about 30 pounds in last 4 months d/t vomiting -check TSH and T4

## 2020-02-16 NOTE — Telephone Encounter (Signed)
Pt scheduled for NP appt with [facility] on 02/16/20.

## 2020-02-16 NOTE — Assessment & Plan Note (Signed)
-  seeing GI -will get labs today -he stopped taking pantoprazole because he states it made his nausea worse -Rx. omeprazole

## 2020-02-17 LAB — CMP14+EGFR
ALT: 10 IU/L (ref 0–44)
AST: 13 IU/L (ref 0–40)
Albumin/Globulin Ratio: 2.3 — ABNORMAL HIGH (ref 1.2–2.2)
Albumin: 4.6 g/dL (ref 4.0–5.0)
Alkaline Phosphatase: 70 IU/L (ref 44–121)
BUN/Creatinine Ratio: 6 — ABNORMAL LOW (ref 9–20)
BUN: 6 mg/dL (ref 6–24)
Bilirubin Total: 0.8 mg/dL (ref 0.0–1.2)
CO2: 26 mmol/L (ref 20–29)
Calcium: 9.5 mg/dL (ref 8.7–10.2)
Chloride: 103 mmol/L (ref 96–106)
Creatinine, Ser: 1.07 mg/dL (ref 0.76–1.27)
GFR calc Af Amer: 98 mL/min/{1.73_m2} (ref >59)
GFR calc non Af Amer: 85 mL/min/{1.73_m2} (ref >59)
Globulin, Total: 2 g/dL (ref 1.5–4.5)
Glucose: 81 mg/dL (ref 65–99)
Potassium: 4 mmol/L (ref 3.5–5.2)
Sodium: 143 mmol/L (ref 134–144)
Total Protein: 6.6 g/dL (ref 6.0–8.5)

## 2020-02-17 LAB — CBC WITH DIFFERENTIAL/PLATELET
Basophils Absolute: 0 10*3/uL (ref 0.0–0.2)
Basos: 1 %
EOS (ABSOLUTE): 0 10*3/uL (ref 0.0–0.4)
Eos: 1 %
Hematocrit: 41 % (ref 37.5–51.0)
Hemoglobin: 14.2 g/dL (ref 13.0–17.7)
Immature Grans (Abs): 0 10*3/uL (ref 0.0–0.1)
Immature Granulocytes: 0 %
Lymphocytes Absolute: 1.1 10*3/uL (ref 0.7–3.1)
Lymphs: 29 %
MCH: 32.6 pg (ref 26.6–33.0)
MCHC: 34.6 g/dL (ref 31.5–35.7)
MCV: 94 fL (ref 79–97)
Monocytes Absolute: 0.3 10*3/uL (ref 0.1–0.9)
Monocytes: 8 %
Neutrophils Absolute: 2.4 10*3/uL (ref 1.4–7.0)
Neutrophils: 61 %
Platelets: 202 10*3/uL (ref 150–450)
RBC: 4.36 x10E6/uL (ref 4.14–5.80)
RDW: 12 % (ref 11.6–15.4)
WBC: 3.9 10*3/uL (ref 3.4–10.8)

## 2020-02-17 LAB — LIPID PANEL WITH LDL/HDL RATIO
Cholesterol, Total: 148 mg/dL (ref 100–199)
HDL: 55 mg/dL (ref >39)
LDL Chol Calc (NIH): 82 mg/dL (ref 0–99)
LDL/HDL Ratio: 1.5 ratio (ref 0.0–3.6)
Triglycerides: 51 mg/dL (ref 0–149)
VLDL Cholesterol Cal: 11 mg/dL (ref 5–40)

## 2020-02-17 LAB — TSH+FREE T4
Free T4: 1.3 ng/dL (ref 0.82–1.77)
TSH: 1.01 u[IU]/mL (ref 0.450–4.500)

## 2020-02-17 LAB — HIV ANTIBODY (ROUTINE TESTING W REFLEX): HIV Screen 4th Generation wRfx: NONREACTIVE

## 2020-02-17 LAB — HCV AB W/RFLX TO VERIFICATION: HCV Ab: 0.2 s/co ratio (ref 0.0–0.9)

## 2020-02-17 LAB — HCV INTERPRETATION

## 2020-02-23 ENCOUNTER — Ambulatory Visit: Payer: Self-pay | Admitting: Nurse Practitioner

## 2020-02-24 ENCOUNTER — Encounter: Payer: Self-pay | Admitting: Nurse Practitioner

## 2020-02-24 ENCOUNTER — Other Ambulatory Visit: Payer: Self-pay

## 2020-02-24 ENCOUNTER — Ambulatory Visit (INDEPENDENT_AMBULATORY_CARE_PROVIDER_SITE_OTHER): Payer: Self-pay | Admitting: Nurse Practitioner

## 2020-02-24 VITALS — BP 120/70 | HR 49 | Temp 98.2°F | Resp 16 | Ht 73.0 in | Wt 124.0 lb

## 2020-02-24 DIAGNOSIS — Z0000 Encounter for general adult medical examination without abnormal findings: Secondary | ICD-10-CM

## 2020-02-24 DIAGNOSIS — R112 Nausea with vomiting, unspecified: Secondary | ICD-10-CM

## 2020-02-24 DIAGNOSIS — R634 Abnormal weight loss: Secondary | ICD-10-CM

## 2020-02-24 DIAGNOSIS — M25561 Pain in right knee: Secondary | ICD-10-CM

## 2020-02-24 NOTE — Assessment & Plan Note (Addendum)
-  no acute issues today -has been a chronic issue, and he has EGD and colonoscopy planned 03/21/20 -was prescribed phenergan by Dr. Aline Brochure

## 2020-02-24 NOTE — Assessment & Plan Note (Addendum)
Wt Readings from Last 3 Encounters:  02/24/20 124 lb (56.2 kg)  02/16/20 122 lb (55.3 kg)  02/02/20 124 lb (56.2 kg)   -thyroid panel was unremarkable -weight is stable over the last month

## 2020-02-24 NOTE — Assessment & Plan Note (Signed)
-  no acute concerns today 

## 2020-02-24 NOTE — Assessment & Plan Note (Signed)
-  declined flu, COVID, and TDaP vaccines today

## 2020-02-24 NOTE — Progress Notes (Signed)
Established Patient Office Visit  Subjective:  Patient ID: Allen Ward, male    DOB: 10-28-1975  Age: 44 y.o. MRN: [id number]  CC:  Chief Complaint  Patient presents with  . Annual Exam    HPI Allen Ward presents for annual physical exam.  He has abdominal pain for which he is followed by GI and has upcoming EGD and colonoscopy on 03/21/20.  He states that he has been unable to eat and to drink due to his pain.  He is seeing a [facility] in [place], and they referred him to [place].   He states he has a hx of hiatal hernia, but recent CT was negative for this.  He states no vomiting episodes since his last appt.     Past Medical History:  Diagnosis Date  . Hiatal hernia   . Pneumothorax   . Pneumothorax 03/21/2013    Past Surgical History:  Procedure Laterality Date  . CHEST TUBE INSERTION      Family History  Problem Relation Age of Onset  . Osteoporosis Mother   . Heart disease Mother   . Heart attack Father   . Heart disease Father   . Obesity Sister   . Hypertension Sister   . Diabetes Sister   . Cancer Sister     Social History   Socioeconomic History  . Marital status: Married    Spouse name: Not on file  . Number of children: Not on file  . Years of education: Not on file  . Highest education level: Not on file  Occupational History  . Not on file  Tobacco Use  . Smoking status: Never Smoker  . Smokeless tobacco: Never Used  Substance and Sexual Activity  . Alcohol use: No  . Drug use: Yes    Types: Marijuana    Comment: occasional to help with appetite  . Sexual activity: Not on file  Other Topics Concern  . Not on file  Social History Narrative  . Not on file   Social Determinants of Health   Financial Resource Strain: Not on file  Food Insecurity: Not on file  Transportation Needs: Not on file  Physical Activity: Not on file  Stress: Not on file  Social Connections: Not on file  Intimate Partner Violence: Not on file     Outpatient Medications Prior to Visit  Medication Sig Dispense Refill  . omeprazole (PRILOSEC) 40 MG capsule Take 1 capsule (40 mg total) by mouth daily. 30 capsule 3  . promethazine (PHENERGAN) 12.5 MG tablet Take 1 tablet (12.5 mg total) by mouth every 6 (six) hours as needed for nausea or vomiting. 30 tablet 0  . famotidine (PEPCID) 20 MG tablet Take 1 tablet (20 mg total) by mouth 2 (two) times daily. 60 tablet 1   No facility-administered medications prior to visit.    Allergies  Allergen Reactions  . Amoxicillin Hives and Shortness Of Breath  . Penicillins Hives and Shortness Of Breath  . Codeine Other (See Comments)    Unknown reaction  . Nsaids Nausea And Vomiting    Significant gastritis after receiving toradol    ROS Review of Systems  Constitutional: Negative.   HENT: Negative.   Eyes: Negative.   Respiratory: Negative.   Cardiovascular: Negative.   Gastrointestinal: Negative.   Endocrine: Negative.   Genitourinary: Negative.   Musculoskeletal: Negative.   Allergic/Immunologic: Negative.   Neurological: Negative.   Hematological: Negative.   Psychiatric/Behavioral: Negative.       Objective:  Physical Exam Constitutional:      Appearance: Normal appearance.  HENT:     Head: Normocephalic and atraumatic.     Right Ear: Tympanic membrane, ear canal and external ear normal.     Left Ear: Tympanic membrane, ear canal and external ear normal.     Nose: Nose normal.     Mouth/Throat:     Mouth: Mucous membranes are moist.     Pharynx: Oropharynx is clear.     Comments: Poor dentition Eyes:     Extraocular Movements: Extraocular movements intact.     Conjunctiva/sclera: Conjunctivae normal.     Pupils: Pupils are equal, round, and reactive to light.  Cardiovascular:     Rate and Rhythm: Normal rate and regular rhythm.     Pulses: Normal pulses.     Heart sounds: Normal heart sounds.  Pulmonary:     Effort: Pulmonary effort is normal.     Breath  sounds: Normal breath sounds.  Abdominal:     General: Bowel sounds are normal. There is no distension.     Palpations: Abdomen is soft. There is no mass.     Tenderness: There is no abdominal tenderness. There is no guarding or rebound.     Hernia: No hernia is present.  Musculoskeletal:        General: Normal range of motion.     Cervical back: Normal range of motion and neck supple.  Skin:    General: Skin is warm and dry.     Capillary Refill: Capillary refill takes less than 2 seconds.  Neurological:     General: No focal deficit present.     Mental Status: He is alert and oriented to person, place, and time.     Cranial Nerves: No cranial nerve deficit.     Sensory: No sensory deficit.     Motor: No weakness.     Coordination: Coordination normal.  Psychiatric:        Mood and Affect: Mood normal.        Behavior: Behavior normal.        Thought Content: Thought content normal.        Judgment: Judgment normal.     BP 120/70   Pulse (!) 49   Temp 98.2 F (36.8 C)   Resp 16   Ht 6\' 1"  (1.854 m)   Wt 124 lb (56.2 kg)   SpO2 99%   BMI 16.36 kg/m  Wt Readings from Last 3 Encounters:  02/24/20 124 lb (56.2 kg)  02/16/20 122 lb (55.3 kg)  02/02/20 124 lb (56.2 kg)     There are no preventive care reminders to display for this patient.  There are no preventive care reminders to display for this patient.  Lab Results  Component Value Date   TSH 1.010 02/16/2020   Lab Results  Component Value Date   WBC 3.9 02/16/2020   HGB 14.2 02/16/2020   HCT 41.0 02/16/2020   MCV 94 02/16/2020   PLT 202 02/16/2020   Lab Results  Component Value Date   NA 143 02/16/2020   K 4.0 02/16/2020   CO2 26 02/16/2020   GLUCOSE 81 02/16/2020   BUN 6 02/16/2020   CREATININE 1.07 02/16/2020   BILITOT 0.8 02/16/2020   ALKPHOS 70 02/16/2020   AST 13 02/16/2020   ALT 10 02/16/2020   PROT 6.6 02/16/2020   ALBUMIN 4.6 02/16/2020   CALCIUM 9.5 02/16/2020   ANIONGAP 9  01/15/2020   Lab Results  Component Value Date   CHOL 148 02/16/2020   Lab Results  Component Value Date   HDL 55 02/16/2020   Lab Results  Component Value Date   LDLCALC 82 02/16/2020   Lab Results  Component Value Date   TRIG 51 02/16/2020   No results found for: CHOLHDL No results found for: HGBA1C    Assessment & Plan:   Problem List Items Addressed This Visit      Digestive   Nausea and vomiting    -no acute issues today -has been a chronic issue, and he has EGD and colonoscopy planned 03/21/20 -was prescribed phenergan by Dr. Aline Brochure        Other   Abnormal intentional weight loss    Wt Readings from Last 3 Encounters:  02/24/20 124 lb (56.2 kg)  02/16/20 122 lb (55.3 kg)  02/02/20 124 lb (56.2 kg)   -thyroid panel was unremarkable -weight is stable over the last month      Routine physical examination    -declined flu, COVID, and TDaP vaccines today      Acute pain of right knee - Primary    -no acute concerns today         No orders of the defined types were placed in this encounter.   Follow-up: Return in about 6 months (around 08/24/2020) for Lab follow-up.    Noreene Larsson, NP

## 2020-02-24 NOTE — Addendum Note (Signed)
Addended by: Demetrius Revel on: 02/24/2020 08:34 AM   Modules accepted: Orders

## 2020-02-24 NOTE — Patient Instructions (Signed)
It was great seeing you again today for your physical exam. I'm glad you haven't had any more vomiting episodes. Please go to your appointment with GI in January for the EGD and colonoscopy.  I'll see you back in 6 months for labs.

## 2020-03-19 ENCOUNTER — Telehealth: Payer: Self-pay | Admitting: Gastroenterology

## 2020-03-19 ENCOUNTER — Other Ambulatory Visit: Payer: Self-pay | Admitting: Gastroenterology

## 2020-03-19 NOTE — Telephone Encounter (Signed)
I spoke with Allen Ward and his wife. We will switch him to Miralax prep and they will come down from [place] to get the instructions. They do not have email or MYCHART.

## 2020-03-19 NOTE — Telephone Encounter (Signed)
Pt's wife Allen Ward called to inform that prep is over $100 and pt cannot afford it. She stated that pt is uninsured and is requesting something more affordable. His procedure is on Wednesday.

## 2020-03-20 LAB — SARS CORONAVIRUS 2 (TAT 6-24 HRS): SARS Coronavirus 2: NEGATIVE

## 2020-03-21 ENCOUNTER — Other Ambulatory Visit: Payer: Self-pay

## 2020-03-21 ENCOUNTER — Encounter: Payer: Self-pay | Admitting: Gastroenterology

## 2020-03-21 ENCOUNTER — Ambulatory Visit (AMBULATORY_SURGERY_CENTER): Payer: Self-pay | Admitting: Gastroenterology

## 2020-03-21 VITALS — BP 96/63 | HR 40 | Temp 97.8°F | Resp 13 | Ht 73.0 in | Wt 124.0 lb

## 2020-03-21 DIAGNOSIS — B9681 Helicobacter pylori [H. pylori] as the cause of diseases classified elsewhere: Secondary | ICD-10-CM

## 2020-03-21 DIAGNOSIS — K269 Duodenal ulcer, unspecified as acute or chronic, without hemorrhage or perforation: Secondary | ICD-10-CM

## 2020-03-21 DIAGNOSIS — D125 Benign neoplasm of sigmoid colon: Secondary | ICD-10-CM

## 2020-03-21 DIAGNOSIS — K2970 Gastritis, unspecified, without bleeding: Secondary | ICD-10-CM

## 2020-03-21 DIAGNOSIS — K2950 Unspecified chronic gastritis without bleeding: Secondary | ICD-10-CM

## 2020-03-21 DIAGNOSIS — R109 Unspecified abdominal pain: Secondary | ICD-10-CM

## 2020-03-21 DIAGNOSIS — Z1211 Encounter for screening for malignant neoplasm of colon: Secondary | ICD-10-CM

## 2020-03-21 DIAGNOSIS — R110 Nausea: Secondary | ICD-10-CM

## 2020-03-21 DIAGNOSIS — D128 Benign neoplasm of rectum: Secondary | ICD-10-CM

## 2020-03-21 DIAGNOSIS — Z800 Family history of malignant neoplasm of digestive organs: Secondary | ICD-10-CM

## 2020-03-21 MED ORDER — SODIUM CHLORIDE 0.9 % IV SOLN: 500.0000 mL | Freq: Once | INTRAVENOUS | Status: AC

## 2020-03-21 NOTE — Progress Notes (Signed)
PT taken to PACU. Monitors in place. VSS. Report given to RN. 

## 2020-03-21 NOTE — Patient Instructions (Signed)
Handouts given: Polyps  *Resume previous diet *Continue current medications:  INCREASE PANTOPRAZOLE TO 40 MG TWICE DAILY FOR 10 WEEKS-   THEN AFTER 10 WEEKS, REDUCE TO 40mg   ONCE DAILY *NO ASPIRIN, IBUPROFEN, NAPROXEN, OR OTHER NSAIDs Await pathology results Plan to repeat  EGD in 12 weeks to document healing  YOU HAD AN ENDOSCOPIC PROCEDURE TODAY AT THE [facility]:   Refer to the procedure report that was given to you for any specific questions about what was found during the examination.  If the procedure report does not answer your questions, please call your gastroenterologist to clarify.  If you requested that your care partner not be given the details of your procedure findings, then the procedure report has been included in a sealed envelope for you to review at your convenience later.  YOU SHOULD EXPECT: Some feelings of bloating in the abdomen. Passage of more gas than usual.  Walking can help get rid of the air that was put into your GI tract during the procedure and reduce the bloating. If you had a lower endoscopy (such as a colonoscopy or flexible sigmoidoscopy) you may notice spotting of blood in your stool or on the toilet paper. If you underwent a bowel prep for your procedure, you may not have a normal bowel movement for a few days.  Please Note:  You might notice some irritation and congestion in your nose or some drainage.  This is from the oxygen used during your procedure.  There is no need for concern and it should clear up in a day or so.  SYMPTOMS TO REPORT IMMEDIATELY:   Following lower endoscopy (colonoscopy or flexible sigmoidoscopy):  Excessive amounts of blood in the stool  Significant tenderness or worsening of abdominal pains  Swelling of the abdomen that is new, acute  Fever of 100F or higher   Following upper endoscopy (EGD)  Vomiting of blood or coffee ground material  New chest pain or pain under the shoulder blades  Painful or  persistently difficult swallowing  New shortness of breath  Fever of 100F or higher  Black, tarry-looking stools  For urgent or emergent issues, a gastroenterologist can be reached at any hour by calling [phone]. Do not use MyChart messaging for urgent concerns.   DIET:  We do recommend a small meal at first, but then you may proceed to your regular diet.  Drink plenty of fluids but you should avoid alcoholic beverages for 24 hours.  ACTIVITY:  You should plan to take it easy for the rest of today and you should NOT DRIVE or use heavy machinery until tomorrow (because of the sedation medicines used during the test).    FOLLOW UP: Our staff will call the number listed on your records 48-72 hours following your procedure to check on you and address any questions or concerns that you may have regarding the information given to you following your procedure. If we do not reach you, we will leave a message.  We will attempt to reach you two times.  During this call, we will ask if you have developed any symptoms of COVID 19. If you develop any symptoms (ie: fever, flu-like symptoms, shortness of breath, cough etc.) before then, please call [phone].  If you test positive for Covid 19 in the 2 weeks post procedure, please call and report this information to [phone].    If any biopsies were taken you will be contacted by phone or by letter within the next  1-3 weeks.  Please call us at [phone] if you have not heard about the biopsies in 3 weeks.   SIGNATURES/CONFIDENTIALITY: You and/or your care partner have signed paperwork which will be entered into your electronic medical record.  These signatures attest to the fact that that the information above on your After Visit Summary has been reviewed and is understood.  Full responsibility of the confidentiality of this discharge information lies with you and/or your care-partner.

## 2020-03-21 NOTE — Op Note (Signed)
[place] Patient Name: Allen Ward Procedure Date: 03/21/2020 2:59 PM MRN: [id number] Endoscopist: Thornton Park MD, MD Age: 45 Referring MD:  Date of Birth: 1975/04/15 Gender: Male Account #: [id number] Procedure:                Colonoscopy Indications:              Screening in patient at increased risk: Family                            history of 1st-degree relative with colorectal                            cancer before age 24 years (father in his 23s,                            paternal grandfather) Medicines:                Monitored Anesthesia Care Procedure:                Pre-Anesthesia Assessment:                           - Prior to the procedure, a History and Physical                            was performed, and patient medications and                            allergies were reviewed. The patient's tolerance of                            previous anesthesia was also reviewed. The risks                            and benefits of the procedure and the sedation                            options and risks were discussed with the patient.                            All questions were answered, and informed consent                            was obtained. Prior Anticoagulants: The patient has                            taken no previous anticoagulant or antiplatelet                            agents. ASA Grade Assessment: II - A patient with                            mild systemic disease. After reviewing the risks  and benefits, the patient was deemed in                            satisfactory condition to undergo the procedure.                           After obtaining informed consent, the colonoscope                            was passed under direct vision. Throughout the                            procedure, the patient's blood pressure, pulse, and                            oxygen saturations were monitored  continuously. The                            Olympus [id number]-[id number]) Colonoscope was                            introduced through the anus and advanced to the 3                            cm into the ileum. The colonoscopy was performed                            without difficulty. The patient tolerated the                            procedure well. The quality of the bowel                            preparation was good. The terminal ileum, ileocecal                            valve, appendiceal orifice, and rectum were                            photographed. Scope In: 3:27:26 PM Scope Out: [id number] PM Scope Withdrawal Time: 0 hours 15 minutes 52 seconds  Total Procedure Duration: 0 hours 19 minutes 32 seconds  Findings:                 The perianal and digital rectal examinations were                            normal.                           Two flat polyps were found in the distal rectum.                            The polyps were 1 to 2 mm in size. These polyps  were removed with a piecemeal technique using a                            cold biopsy forceps. Resection and retrieval were                            complete. Estimated blood loss was minimal.                           A 2 mm polyp was found in the sigmoid colon. The                            polyp was sessile. The polyp was removed with a                            cold snare. Resection and retrieval were complete.                            Estimated blood loss was minimal.                           The exam was otherwise without abnormality on                            direct and retroflexion views. Complications:            No immediate complications. Estimated blood loss:                            Minimal. Estimated Blood Loss:     Estimated blood loss was minimal. Impression:               - Two 1 to 2 mm polyps in the rectum, removed                            piecemeal using  a cold biopsy forceps. Resected and                            retrieved.                           - One 2 mm polyp in the sigmoid colon, removed with                            a cold snare. Resected and retrieved.                           - The examination was otherwise normal on direct                            and retroflexion views. Recommendation:           - Patient has a contact number available for  emergencies. The signs and symptoms of potential                            delayed complications were discussed with the                            patient. Return to normal activities tomorrow.                            Written discharge instructions were provided to the                            patient.                           - Resume previous diet.                           - Continue present medications.                           - Await pathology results.                           - Repeat colonoscopy date to be determined after                            pending pathology results are reviewed for                            surveillance.                           - Emerging evidence supports eating a diet of                            fruits, vegetables, grains, calcium, and yogurt                            while reducing red meat and alcohol may reduce the                            risk of colon cancer.                           - Thank you for allowing me to be involved in your                            colon cancer prevention. Thornton Park MD, MD 03/21/2020 4:02:52 PM This report has been signed electronically.

## 2020-03-21 NOTE — Progress Notes (Signed)
Called to room to assist during endoscopic procedure.  Patient ID and intended procedure confirmed with present staff. Received instructions for my participation in the procedure from the performing physician.  

## 2020-03-21 NOTE — Op Note (Addendum)
[place] Patient Name: Allen Ward Procedure Date: 03/21/2020 2:58 PM MRN: [id number] Endoscopist: Thornton Park MD, MD Age: 45 Referring MD:  Date of Birth: 08-04-1975 Gender: Male Account #: [id number] Procedure:                Upper GI endoscopy Indications:              Generalized abdominal pain, Nausea Medicines:                Monitored Anesthesia Care Procedure:                Pre-Anesthesia Assessment:                           - Prior to the procedure, a History and Physical                            was performed, and patient medications and                            allergies were reviewed. The patient's tolerance of                            previous anesthesia was also reviewed. The risks                            and benefits of the procedure and the sedation                            options and risks were discussed with the patient.                            All questions were answered, and informed consent                            was obtained. Prior Anticoagulants: The patient has                            taken no previous anticoagulant or antiplatelet                            agents. ASA Grade Assessment: II - A patient with                            mild systemic disease. After reviewing the risks                            and benefits, the patient was deemed in                            satisfactory condition to undergo the procedure.                           After obtaining informed consent, the endoscope was  passed under direct vision. Throughout the                            procedure, the patient's blood pressure, pulse, and                            oxygen saturations were monitored continuously. The                            Endoscope was introduced through the mouth, and                            advanced to the second part of duodenum. The upper                            GI endoscopy was  accomplished without difficulty.                            The patient tolerated the procedure well. Scope In: Scope Out: Findings:                 The esophagus was normal.                           Mildly erythematous mucosa was found in the gastric                            antrum. Biopsies were taken from the antrum, body,                            and fundus with a cold forceps for histology.                            Estimated blood loss was minimal.                           Two non-bleeding cratered duodenal ulcers with no                            stigmata of bleeding were found in the duodenal                            bulb. The largest lesion was 10 mm in largest                            dimension. The other ulcer was linear. Biopsies                            were taken from the duodenal bulb (not the ulcer)                            with a cold forceps for histology. Estimated blood  loss was minimal.                           The cardia and gastric fundus were normal on                            retroflexion.                           The exam was otherwise without abnormality. Complications:            No immediate complications. Estimated blood loss:                            Minimal. Estimated Blood Loss:     Estimated blood loss was minimal. Impression:               - Normal esophagus.                           - Erythematous mucosa in the antrum. Biopsied.                           - Non-bleeding duodenal ulcers with no stigmata of                            bleeding. Biopsied.                           - The examination was otherwise normal. Recommendation:           - Patient has a contact number available for                            emergencies. The signs and symptoms of potential                            delayed complications were discussed with the                            patient. Return to normal activities  tomorrow.                            Written discharge instructions were provided to the                            patient.                           - Resume previous diet.                           - Continue present medications. Increase                            pantoprazole to 40 mg BID x 10 weeks. Then, reduce  to 40 mg QD.                           - No aspirin, ibuprofen, naproxen, or other                            non-steroidal anti-inflammatory drugs.                           - Await pathology results.                           - Plan repeat EGD in 12 weeks to document healing                            if biopsy results are negative for H pylori. Thornton Park MD, MD 03/21/2020 3:58:12 PM This report has been signed electronically.

## 2020-03-22 ENCOUNTER — Other Ambulatory Visit: Payer: Self-pay | Admitting: Orthopedic Surgery

## 2020-03-23 ENCOUNTER — Telehealth: Payer: Self-pay

## 2020-03-23 NOTE — Telephone Encounter (Signed)
LVM

## 2020-03-23 NOTE — Telephone Encounter (Signed)
Second post procedure follow up call, no answer 

## 2020-03-27 ENCOUNTER — Other Ambulatory Visit: Payer: Self-pay

## 2020-03-27 MED ORDER — TETRACYCLINE HCL 500 MG PO CAPS: 500.0000 mg | ORAL_CAPSULE | Freq: Four times a day (QID) | ORAL | 0 refills | Status: DC

## 2020-03-27 MED ORDER — PANTOPRAZOLE SODIUM 40 MG PO TBEC: 40.0000 mg | DELAYED_RELEASE_TABLET | Freq: Two times a day (BID) | ORAL | 0 refills | Status: AC

## 2020-03-27 MED ORDER — METRONIDAZOLE 500 MG PO TABS: 500.0000 mg | ORAL_TABLET | Freq: Four times a day (QID) | ORAL | 0 refills | Status: DC

## 2020-03-27 MED ORDER — BISMUTH 262 MG PO CHEW: 524.0000 mg | CHEWABLE_TABLET | Freq: Four times a day (QID) | ORAL | 0 refills | Status: DC

## 2020-08-24 ENCOUNTER — Ambulatory Visit: Payer: Self-pay | Admitting: Nurse Practitioner

## 2020-10-29 ENCOUNTER — Encounter: Payer: Self-pay | Admitting: Gastroenterology

## 2024-01-23 ENCOUNTER — Other Ambulatory Visit: Payer: Self-pay

## 2024-01-23 ENCOUNTER — Encounter (HOSPITAL_COMMUNITY): Payer: Self-pay | Admitting: Emergency Medicine

## 2024-01-23 ENCOUNTER — Emergency Department (HOSPITAL_COMMUNITY)
Admission: EM | Admit: 2024-01-23 | Discharge: 2024-01-23 | Disposition: A | Discharge status: Home / Self Care | Attending: Emergency Medicine | Admitting: Emergency Medicine

## 2024-01-23 DIAGNOSIS — T1591XA Foreign body on external eye, part unspecified, right eye, initial encounter: Secondary | ICD-10-CM

## 2024-01-23 DIAGNOSIS — T1501XA Foreign body in cornea, right eye, initial encounter: Secondary | ICD-10-CM | POA: Insufficient documentation

## 2024-01-23 DIAGNOSIS — W44E0XA Non-magnetic metal object unspecified, entering into or through a natural orifice, initial encounter: Secondary | ICD-10-CM | POA: Diagnosis not present

## 2024-01-23 MED ORDER — HYDROCODONE-ACETAMINOPHEN 5-325 MG PO TABS: 1.0000 | ORAL_TABLET | Freq: Four times a day (QID) | ORAL | 0 refills | Status: AC | PRN

## 2024-01-23 MED ORDER — ONDANSETRON 4 MG PO TBDP
4.0000 mg | ORAL_TABLET | Freq: Once | ORAL | Status: AC
Start: 2024-01-23 — End: 2024-01-23
  Administered 2024-01-23: 4 mg via ORAL
  Filled 2024-01-23: qty 1

## 2024-01-23 MED ORDER — HYDROCODONE-ACETAMINOPHEN 5-325 MG PO TABS
1.0000 | ORAL_TABLET | Freq: Once | ORAL | Status: AC
  Administered 2024-01-23: 1 via ORAL
  Filled 2024-01-23 (×2): qty 1

## 2024-01-23 MED ORDER — TETRACAINE HCL 0.5 % OP SOLN
2.0000 [drp] | Freq: Once | OPHTHALMIC | Status: AC
  Administered 2024-01-23: 2 [drp] via OPHTHALMIC
  Filled 2024-01-23: qty 4

## 2024-01-23 MED ORDER — ONDANSETRON 4 MG PO TBDP: 4.0000 mg | ORAL_TABLET | Freq: Three times a day (TID) | ORAL | 0 refills | Status: AC | PRN

## 2024-01-23 MED ORDER — FLUORESCEIN SODIUM 1 MG OP STRP
1.0000 | ORAL_STRIP | Freq: Once | OPHTHALMIC | Status: AC
  Administered 2024-01-23: 1 via OPHTHALMIC
  Filled 2024-01-23: qty 1

## 2024-01-23 NOTE — ED Provider Notes (Signed)
 [place] EMERGENCY DEPARTMENT AT [facility]  Provider Note  CSN: [id number] Arrival date & time: 01/23/24 0026  History Chief Complaint  Patient presents with   Foreign Body in Eye    Allen Ward is a 48 y.o. male here for eye pain. Reports he was grinding metal a few hours ago and his safety glasses slipped down. He thinks he may have gotten some metal FB in both eyes.    Home Medications Prior to Admission medications   Medication Sig Start Date End Date Taking? Authorizing Provider  HYDROcodone -acetaminophen  (NORCO/VICODIN) 5-325 MG tablet Take 1 tablet by mouth every 6 (six) hours as needed for severe pain (pain score 7-10). 01/23/24  Yes Roselyn Carlin NOVAK, MD  ondansetron  (ZOFRAN -ODT) 4 MG disintegrating tablet Take 1 tablet (4 mg total) by mouth every 8 (eight) hours as needed for nausea or vomiting. 01/23/24  Yes Roselyn Carlin NOVAK, MD  pantoprazole  (PROTONIX ) 40 MG tablet Take 1 tablet (40 mg total) by mouth 2 (two) times daily before a meal. 03/27/20   Eda Iha, MD     Allergies    Amoxicillin, Penicillins, Codeine, and Nsaids   Review of Systems   Review of Systems Please see HPI for pertinent positives and negatives  Physical Exam BP (!) 141/87 (BP Location: Left Arm)   Pulse (!) 53   Temp (!) 97.5 F (36.4 C) (Axillary)   Resp 18   Ht 6' 1 (1.854 m)   Wt 59 kg   SpO2 100%   BMI 17.15 kg/m   Physical Exam Vitals and nursing note reviewed.  HENT:     Head: Normocephalic.     Nose: Nose normal.  Eyes:     Comments: Patient unable to tolerate eye exam due to pain  Pulmonary:     Effort: Pulmonary effort is normal.  Musculoskeletal:        General: Normal range of motion.     Cervical back: Neck supple.  Skin:    Findings: No rash (on exposed skin).  Neurological:     Mental Status: He is alert and oriented to person, place, and time.  Psychiatric:        Mood and Affect: Mood normal.     ED Results / Procedures /  Treatments   EKG None  Procedures Procedures  Medications Ordered in the ED Medications  HYDROcodone -acetaminophen  (NORCO/VICODIN) 5-325 MG per tablet 1 tablet (1 tablet Oral Patient Refused/Not Given 01/23/24 0115)  ondansetron  (ZOFRAN -ODT) disintegrating tablet 4 mg (has no administration in time range)  tetracaine  (PONTOCAINE) 0.5 % ophthalmic solution 2 drop (2 drops Both Eyes Given 01/23/24 0121)  fluorescein  ophthalmic strip 1 strip (1 strip Both Eyes Given by Other 01/23/24 0144)    Initial Impression and Plan  Patient here with possible metal FB in both eyes. He is unable to tolerate exam even after tetracaine . Will give norco and some additional time for analgesia and return for a recheck.   ED Course   Clinical Course as of 01/23/24 9795  Sat Jan 23, 2024  0138 Patient declined oral pain meds due to concerns for nausea.  [CS]  0151 Pain improved with second drop of tetracaine , there is no FB in L eye. There is a small rust ring without FB on the R cornea at 9 o'clock. Given it is weekend, will discuss with Ophtho to arrange follow up.  [CS]  0200 Spoke with Dr. Octavia, Ophtho who will see the patient in his office int he  AM. Patient is amenable to trying oral pain medications with antiemetics.  [CS]    Clinical Course User Index [CS] Roselyn Carlin NOVAK, MD     MDM Rules/Calculators/A&P Medical Decision Making Problems Addressed: FB eye, right, initial encounter: acute illness or injury  Risk Prescription drug management.     Final Clinical Impression(s) / ED Diagnoses Final diagnoses:  FB eye, right, initial encounter    Rx / DC Orders ED Discharge Orders          Ordered    HYDROcodone -acetaminophen  (NORCO/VICODIN) 5-325 MG tablet  Every 6 hours PRN        01/23/24 0203    ondansetron  (ZOFRAN -ODT) 4 MG disintegrating tablet  Every 8 hours PRN        01/23/24 0203             Roselyn Carlin NOVAK, MD 01/23/24 [phone]

## 2024-01-23 NOTE — ED Triage Notes (Signed)
 Pt states he was grinding yesterday and got some metal in both eyes. Reports wearing safety glasses but states it went behind them. PT states he tried flushing out eyes. Pt c/o burning and states he is unable to open his eyes.
